# Patient Record
Sex: Female | Born: 1965 | Race: White | Hispanic: Yes | Marital: Married | Smoking: Never smoker
Health system: Southern US, Community
[De-identification: ages and names within clinical notes are randomized; demographics above are authoritative.]

## PROBLEM LIST (undated history)

## (undated) DIAGNOSIS — R51 Headache: Secondary | ICD-10-CM

## (undated) DIAGNOSIS — Z889 Allergy status to unspecified drugs, medicaments and biological substances status: Secondary | ICD-10-CM

## (undated) DIAGNOSIS — R519 Headache, unspecified: Secondary | ICD-10-CM

## (undated) DIAGNOSIS — M1990 Unspecified osteoarthritis, unspecified site: Secondary | ICD-10-CM

## (undated) DIAGNOSIS — K219 Gastro-esophageal reflux disease without esophagitis: Secondary | ICD-10-CM

## (undated) HISTORY — PX: HERNIA REPAIR: SHX51

---

## 1999-12-31 ENCOUNTER — Inpatient Hospital Stay (HOSPITAL_COMMUNITY): Admission: AD | Admit: 1999-12-31 | Discharge: 1999-12-31 | Payer: Self-pay | Admitting: Obstetrics

## 2000-01-01 ENCOUNTER — Encounter: Payer: Self-pay | Admitting: Obstetrics

## 2000-01-07 ENCOUNTER — Ambulatory Visit (HOSPITAL_COMMUNITY): Admission: RE | Admit: 2000-01-07 | Discharge: 2000-01-07 | Payer: Self-pay | Admitting: *Deleted

## 2002-05-11 ENCOUNTER — Ambulatory Visit (HOSPITAL_COMMUNITY): Admission: RE | Admit: 2002-05-11 | Discharge: 2002-05-11 | Payer: Self-pay | Admitting: *Deleted

## 2002-08-09 ENCOUNTER — Ambulatory Visit (HOSPITAL_COMMUNITY): Admission: RE | Admit: 2002-08-09 | Discharge: 2002-08-09 | Payer: Self-pay | Admitting: *Deleted

## 2002-10-12 ENCOUNTER — Inpatient Hospital Stay (HOSPITAL_COMMUNITY): Admission: AD | Admit: 2002-10-12 | Discharge: 2002-10-12 | Payer: Self-pay | Admitting: Obstetrics & Gynecology

## 2002-10-16 ENCOUNTER — Inpatient Hospital Stay (HOSPITAL_COMMUNITY): Admission: AD | Admit: 2002-10-16 | Discharge: 2002-10-16 | Payer: Self-pay | Admitting: Obstetrics & Gynecology

## 2002-10-16 ENCOUNTER — Encounter: Payer: Self-pay | Admitting: Obstetrics & Gynecology

## 2002-10-19 ENCOUNTER — Inpatient Hospital Stay (HOSPITAL_COMMUNITY): Admission: AD | Admit: 2002-10-19 | Discharge: 2002-10-19 | Payer: Self-pay | Admitting: Obstetrics & Gynecology

## 2002-10-20 ENCOUNTER — Inpatient Hospital Stay (HOSPITAL_COMMUNITY): Admission: AD | Admit: 2002-10-20 | Discharge: 2002-10-22 | Payer: Self-pay | Admitting: *Deleted

## 2006-05-25 ENCOUNTER — Encounter (INDEPENDENT_AMBULATORY_CARE_PROVIDER_SITE_OTHER): Payer: Self-pay | Admitting: Nurse Practitioner

## 2006-05-25 LAB — CONVERTED CEMR LAB: Pap Smear: NORMAL

## 2006-09-29 ENCOUNTER — Ambulatory Visit: Payer: Self-pay | Admitting: Family Medicine

## 2006-09-29 ENCOUNTER — Encounter (INDEPENDENT_AMBULATORY_CARE_PROVIDER_SITE_OTHER): Payer: Self-pay | Admitting: Nurse Practitioner

## 2006-09-29 DIAGNOSIS — J309 Allergic rhinitis, unspecified: Secondary | ICD-10-CM | POA: Insufficient documentation

## 2006-09-29 DIAGNOSIS — G43909 Migraine, unspecified, not intractable, without status migrainosus: Secondary | ICD-10-CM | POA: Insufficient documentation

## 2006-09-29 DIAGNOSIS — S335XXA Sprain of ligaments of lumbar spine, initial encounter: Secondary | ICD-10-CM

## 2006-09-29 LAB — CONVERTED CEMR LAB
AST: 15 units/L (ref 0–37)
Albumin: 4.2 g/dL (ref 3.5–5.2)
BUN: 10 mg/dL (ref 6–23)
Basophils Absolute: 0.1 10*3/uL (ref 0.0–0.1)
CO2: 25 meq/L (ref 19–32)
Cholesterol: 188 mg/dL (ref 0–200)
Glucose, Bld: 82 mg/dL (ref 70–99)
HDL: 49 mg/dL (ref >39)
Hemoglobin: 13.2 g/dL (ref 12.0–15.0)
LDL Cholesterol: 109 mg/dL — ABNORMAL HIGH (ref 0–99)
Lymphocytes Relative: 35 % (ref 12–46)
Lymphs Abs: 3.1 10*3/uL (ref 0.7–3.3)
MCHC: 31.1 g/dL (ref 30.0–36.0)
MCV: 89.1 fL (ref 78.0–100.0)
Monocytes Absolute: 0.6 10*3/uL (ref 0.2–0.7)
Monocytes Relative: 7 % (ref 3–11)
Platelets: 345 10*3/uL (ref 150–400)
Potassium: 4.5 meq/L (ref 3.5–5.3)
RBC: 4.77 M/uL (ref 3.87–5.11)
Total Bilirubin: 0.5 mg/dL (ref 0.3–1.2)
Total CHOL/HDL Ratio: 3.8
Triglycerides: 148 mg/dL (ref <150)
VLDL: 30 mg/dL (ref 0–40)
WBC, blood: 8.9 10*3/uL

## 2006-10-01 ENCOUNTER — Ambulatory Visit: Payer: Self-pay | Admitting: *Deleted

## 2006-10-08 ENCOUNTER — Encounter: Payer: Self-pay | Admitting: Nurse Practitioner

## 2006-11-05 ENCOUNTER — Telehealth (INDEPENDENT_AMBULATORY_CARE_PROVIDER_SITE_OTHER): Payer: Self-pay | Admitting: *Deleted

## 2006-11-11 ENCOUNTER — Ambulatory Visit: Payer: Self-pay | Admitting: Nurse Practitioner

## 2006-11-11 DIAGNOSIS — B351 Tinea unguium: Secondary | ICD-10-CM

## 2006-12-06 ENCOUNTER — Telehealth (INDEPENDENT_AMBULATORY_CARE_PROVIDER_SITE_OTHER): Payer: Self-pay | Admitting: *Deleted

## 2006-12-14 ENCOUNTER — Ambulatory Visit: Payer: Self-pay | Admitting: Nurse Practitioner

## 2007-03-28 ENCOUNTER — Ambulatory Visit: Payer: Self-pay | Admitting: Nurse Practitioner

## 2007-03-28 DIAGNOSIS — N3289 Other specified disorders of bladder: Secondary | ICD-10-CM

## 2007-03-28 LAB — CONVERTED CEMR LAB
Bilirubin Urine: NEGATIVE
Blood in Urine, dipstick: NEGATIVE
Ketones, urine, test strip: NEGATIVE
Protein, U semiquant: NEGATIVE
Specific Gravity, Urine: >=1.03
WBC Urine, dipstick: NEGATIVE

## 2007-12-19 ENCOUNTER — Ambulatory Visit: Payer: Self-pay | Admitting: Nurse Practitioner

## 2007-12-19 DIAGNOSIS — M545 Low back pain: Secondary | ICD-10-CM

## 2007-12-19 DIAGNOSIS — I8390 Asymptomatic varicose veins of unspecified lower extremity: Secondary | ICD-10-CM

## 2007-12-19 DIAGNOSIS — N946 Dysmenorrhea, unspecified: Secondary | ICD-10-CM

## 2007-12-19 DIAGNOSIS — M722 Plantar fascial fibromatosis: Secondary | ICD-10-CM

## 2007-12-19 DIAGNOSIS — J029 Acute pharyngitis, unspecified: Secondary | ICD-10-CM | POA: Insufficient documentation

## 2007-12-20 ENCOUNTER — Encounter (INDEPENDENT_AMBULATORY_CARE_PROVIDER_SITE_OTHER): Payer: Self-pay | Admitting: Nurse Practitioner

## 2008-01-04 ENCOUNTER — Telehealth (INDEPENDENT_AMBULATORY_CARE_PROVIDER_SITE_OTHER): Payer: Self-pay | Admitting: Nurse Practitioner

## 2008-01-06 ENCOUNTER — Ambulatory Visit (HOSPITAL_COMMUNITY): Admission: RE | Admit: 2008-01-06 | Discharge: 2008-01-06 | Payer: Self-pay | Admitting: Family Medicine

## 2008-01-06 ENCOUNTER — Encounter (INDEPENDENT_AMBULATORY_CARE_PROVIDER_SITE_OTHER): Payer: Self-pay | Admitting: Nurse Practitioner

## 2008-01-11 ENCOUNTER — Encounter (INDEPENDENT_AMBULATORY_CARE_PROVIDER_SITE_OTHER): Payer: Self-pay | Admitting: Nurse Practitioner

## 2008-03-08 ENCOUNTER — Telehealth (INDEPENDENT_AMBULATORY_CARE_PROVIDER_SITE_OTHER): Payer: Self-pay | Admitting: *Deleted

## 2008-03-13 ENCOUNTER — Ambulatory Visit: Payer: Self-pay | Admitting: Nurse Practitioner

## 2008-03-13 DIAGNOSIS — J329 Chronic sinusitis, unspecified: Secondary | ICD-10-CM | POA: Insufficient documentation

## 2008-05-21 ENCOUNTER — Ambulatory Visit: Payer: Self-pay | Admitting: Nurse Practitioner

## 2008-05-21 DIAGNOSIS — M25519 Pain in unspecified shoulder: Secondary | ICD-10-CM

## 2008-05-21 LAB — CONVERTED CEMR LAB
BUN: 11 mg/dL (ref 6–23)
CO2: 28 meq/L (ref 19–32)
Calcium: 9.8 mg/dL (ref 8.4–10.5)
Creatinine, Ser: 0.55 mg/dL (ref 0.40–1.20)
Glucose, Bld: 97 mg/dL (ref 70–99)
Sodium: 137 meq/L (ref 135–145)

## 2008-05-23 ENCOUNTER — Ambulatory Visit (HOSPITAL_COMMUNITY): Admission: RE | Admit: 2008-05-23 | Discharge: 2008-05-23 | Payer: Self-pay | Admitting: Family Medicine

## 2008-06-01 ENCOUNTER — Encounter (INDEPENDENT_AMBULATORY_CARE_PROVIDER_SITE_OTHER): Payer: Self-pay | Admitting: Nurse Practitioner

## 2008-06-09 ENCOUNTER — Encounter (INDEPENDENT_AMBULATORY_CARE_PROVIDER_SITE_OTHER): Payer: Self-pay | Admitting: Nurse Practitioner

## 2008-06-09 LAB — CONVERTED CEMR LAB
Cholesterol: 184 mg/dL
Total CHOL/HDL Ratio: 3.5
VLDL: 24 mg/dL

## 2008-07-05 ENCOUNTER — Ambulatory Visit: Payer: Self-pay | Admitting: Nurse Practitioner

## 2008-07-05 DIAGNOSIS — D649 Anemia, unspecified: Secondary | ICD-10-CM

## 2008-07-06 DIAGNOSIS — D509 Iron deficiency anemia, unspecified: Secondary | ICD-10-CM

## 2008-07-06 LAB — CONVERTED CEMR LAB
Platelets: 338 10*3/uL (ref 150–400)
RDW: 16.7 % — ABNORMAL HIGH (ref 11.5–15.5)
WBC: 7.7 10*3/uL (ref 4.0–10.5)

## 2008-07-10 ENCOUNTER — Encounter (INDEPENDENT_AMBULATORY_CARE_PROVIDER_SITE_OTHER): Payer: Self-pay | Admitting: Nurse Practitioner

## 2008-07-16 ENCOUNTER — Encounter (INDEPENDENT_AMBULATORY_CARE_PROVIDER_SITE_OTHER): Payer: Self-pay | Admitting: Nurse Practitioner

## 2009-04-22 ENCOUNTER — Encounter: Admission: RE | Admit: 2009-04-22 | Discharge: 2009-04-22 | Payer: Self-pay | Admitting: Specialist

## 2009-07-08 ENCOUNTER — Ambulatory Visit: Payer: Self-pay | Admitting: Gynecology

## 2009-07-18 ENCOUNTER — Ambulatory Visit: Payer: Self-pay | Admitting: Nurse Practitioner

## 2009-07-18 DIAGNOSIS — K644 Residual hemorrhoidal skin tags: Secondary | ICD-10-CM | POA: Insufficient documentation

## 2009-07-18 DIAGNOSIS — M2550 Pain in unspecified joint: Secondary | ICD-10-CM | POA: Insufficient documentation

## 2009-07-18 DIAGNOSIS — K5900 Constipation, unspecified: Secondary | ICD-10-CM | POA: Insufficient documentation

## 2009-07-18 LAB — CONVERTED CEMR LAB
ALT: 11 units/L (ref 0–35)
Alkaline Phosphatase: 34 units/L — ABNORMAL LOW (ref 39–117)
BUN: 15 mg/dL (ref 6–23)
Basophils Absolute: 0 10*3/uL (ref 0.0–0.1)
Bilirubin Urine: NEGATIVE
Blood in Urine, dipstick: NEGATIVE
CRP: 0.1 mg/dL (ref <0.6)
Calcium: 8.8 mg/dL (ref 8.4–10.5)
Chlamydia, DNA Probe: NEGATIVE
Eosinophils Relative: 1 % (ref 0–5)
GC Probe Amp, Genital: NEGATIVE
Lymphocytes Relative: 29 % (ref 12–46)
MCHC: 30.6 g/dL (ref 30.0–36.0)
MCV: 82.4 fL (ref 78.0–100.0)
Monocytes Relative: 7 % (ref 3–12)
Neutrophils Relative %: 63 % (ref 43–77)
OCCULT 1: NEGATIVE
Protein, U semiquant: NEGATIVE
Specific Gravity, Urine: >=1.03
TSH: 1.437 microintl units/mL (ref 0.350–4.500)
Total Protein: 6.6 g/dL (ref 6.0–8.3)
Urobilinogen, UA: 0.2
WBC Urine, dipstick: NEGATIVE
WBC: 6.7 10*3/uL (ref 4.0–10.5)

## 2009-07-23 ENCOUNTER — Encounter (INDEPENDENT_AMBULATORY_CARE_PROVIDER_SITE_OTHER): Payer: Self-pay | Admitting: Nurse Practitioner

## 2009-07-25 LAB — CONVERTED CEMR LAB: Pap Smear: NEGATIVE

## 2009-08-09 ENCOUNTER — Ambulatory Visit: Payer: Self-pay | Admitting: Nurse Practitioner

## 2009-08-09 LAB — CONVERTED CEMR LAB
Cholesterol: 187 mg/dL (ref 0–200)
Total CHOL/HDL Ratio: 3.7
Triglycerides: 146 mg/dL (ref <150)
VLDL: 29 mg/dL (ref 0–40)

## 2009-08-12 ENCOUNTER — Encounter (INDEPENDENT_AMBULATORY_CARE_PROVIDER_SITE_OTHER): Payer: Self-pay | Admitting: Nurse Practitioner

## 2009-08-12 ENCOUNTER — Ambulatory Visit (HOSPITAL_COMMUNITY): Admission: RE | Admit: 2009-08-12 | Discharge: 2009-08-12 | Payer: Self-pay | Admitting: Internal Medicine

## 2009-08-16 ENCOUNTER — Encounter: Admission: RE | Admit: 2009-08-16 | Discharge: 2009-08-16 | Payer: Self-pay | Admitting: Internal Medicine

## 2009-08-16 ENCOUNTER — Encounter (INDEPENDENT_AMBULATORY_CARE_PROVIDER_SITE_OTHER): Payer: Self-pay | Admitting: Nurse Practitioner

## 2009-10-28 ENCOUNTER — Emergency Department (HOSPITAL_COMMUNITY): Admission: EM | Admit: 2009-10-28 | Discharge: 2009-10-28 | Payer: Self-pay | Admitting: Emergency Medicine

## 2010-02-10 ENCOUNTER — Encounter
Admission: RE | Admit: 2010-02-10 | Discharge: 2010-02-10 | Payer: Self-pay | Source: Home / Self Care | Attending: Specialist | Admitting: Specialist

## 2010-02-27 ENCOUNTER — Ambulatory Visit: Admit: 2010-02-27 | Payer: Self-pay | Admitting: Nurse Practitioner

## 2010-03-16 ENCOUNTER — Encounter: Payer: Self-pay | Admitting: Family Medicine

## 2010-03-17 ENCOUNTER — Ambulatory Visit
Admission: RE | Admit: 2010-03-17 | Discharge: 2010-03-17 | Payer: Self-pay | Source: Home / Self Care | Attending: Nurse Practitioner | Admitting: Nurse Practitioner

## 2010-03-17 DIAGNOSIS — N390 Urinary tract infection, site not specified: Secondary | ICD-10-CM | POA: Insufficient documentation

## 2010-03-17 LAB — CONVERTED CEMR LAB
Blood in Urine, dipstick: NEGATIVE
Ketones, urine, test strip: NEGATIVE
Protein, U semiquant: NEGATIVE
WBC Urine, dipstick: NEGATIVE

## 2010-03-18 ENCOUNTER — Encounter (INDEPENDENT_AMBULATORY_CARE_PROVIDER_SITE_OTHER): Payer: Self-pay | Admitting: Nurse Practitioner

## 2010-03-23 LAB — CONVERTED CEMR LAB
ALT: 12 units/L (ref 0–35)
Basophils Relative: 1 % (ref 0–1)
Chloride: 103 meq/L (ref 96–112)
Creatinine, Ser: 0.49 mg/dL (ref 0.40–1.20)
Eosinophils Absolute: 0.1 10*3/uL (ref 0.0–0.7)
Eosinophils Relative: 1 % (ref 0–5)
HCT: 41.5 % (ref 36.0–46.0)
HDL: 50 mg/dL (ref >39)
Hemoglobin: 12.6 g/dL (ref 12.0–15.0)
Lymphocytes Relative: 35 % (ref 12–46)
MCHC: 30.4 g/dL (ref 30.0–36.0)
Neutro Abs: 4.3 10*3/uL (ref 1.7–7.7)
Neutrophils Relative %: 57 % (ref 43–77)
Platelets: 309 10*3/uL (ref 150–400)
Potassium: 4.6 meq/L (ref 3.5–5.3)
RDW: 15.4 % (ref 11.5–15.5)
Sodium: 137 meq/L (ref 135–145)
Total Bilirubin: 0.4 mg/dL (ref 0.3–1.2)
Total CHOL/HDL Ratio: 3.8
Urobilinogen, UA: 0.2
VLDL: 20 mg/dL (ref 0–40)
WBC Urine, dipstick: NEGATIVE
WBC: 7.5 10*3/uL (ref 4.0–10.5)
pH: 5.5

## 2010-03-25 NOTE — Letter (Signed)
Summary: *HSN Results Follow up  [facility]  [address] [place], [place] [address]   Phone: [phone]  Fax: [phone]      07/23/2009   Brittany Henry [address] [place], [place]  [address]   Dear  Ms. [name],                            ____S.Drinkard,FNP   ____D. Gore,FNP       ____B. McPherson,MD   ____V. Rankins,MD    ____E. Mulberry,MD    _X___N. Daphine Deutscher, FNP  ____D. Reche Dixon, MD    ____K. Philipp Deputy, MD    ____Other     This letter is to inform you that your recent test(s):  ___X____Pap Smear    __X_____Lab Test     _______X-ray    ___X____ is within acceptable limits  _______ requires a medication change  _______ requires a follow-up lab visit  _______ requires a follow-up visit with your provider   Comments: Labs done during recent office visit are ok except you are slightly anemic.  You should be taking an iron supplement daily - ferrous sulfate 325mg  by mouth  daily, which can be purchased over the counter.  Pap Smear results ________________________.      _________________________________________________________ If you have any questions, please contact our office [phone].                    Sincerely,    Lehman Prom FNP [facility]

## 2010-03-25 NOTE — Assessment & Plan Note (Signed)
Summary: return to office on next week for nurse visit-- lipids//gk  Nurse Visit   Allergies: No Known Drug Allergies  Orders Added: 1)  Est. Patient Level I [60454] 2)  T-Lipid Profile [09811-91478]

## 2010-03-25 NOTE — Letter (Signed)
Summary: Lipid Letter  [facility]  [address] [place], [place] [address]   Phone: [phone]  Fax: [phone]    08/12/2009  Brittany Henry [address] [place], [place]  [address]  Dear Brittany Henry:  We have carefully reviewed your last lipid profile from 08/09/2009 and the results are noted below with a summary of recommendations for lipid management.    Cholesterol:       187     Goal: less than 200   HDL "good" Cholesterol:   51     Goal: greater than 40   LDL "bad" Cholesterol:   107     Goal: less than 130   Triglycerides:       146     Goal: less than 130    Your cholesterol labs are shown above.  No treatment necessary.     Current Medications: 1)    Allegra 180 Mg Tabs (Fexofenadine hcl) .... One tablet by mouth daily for allergies 2)    Naprosyn 500 Mg Tabs (Naproxen) .Marland Kitchen.. 1 tablet by mouth two times a day as needed for pain 3)    Nasacort Aq 55 Mcg/act Aers (Triamcinolone acetonide(nasal)) .Marland Kitchen.. 1 spray in each nostril two times a day 4)    Ferrous Sulfate 325 (65 Fe) Mg Tbec (Ferrous sulfate) .... One tablet by mouth two times a day to build up blood  If you have any questions, please call. We appreciate being able to work with you.   Sincerely,    [facility] Lehman Prom FNP

## 2010-03-25 NOTE — Letter (Signed)
Summary: Handout Printed  Printed Handout:  - Diet - High-Fiber 

## 2010-03-25 NOTE — Assessment & Plan Note (Signed)
Summary: Complete Physical Exam   Vital Signs:  Patient profile:   45 year old female Menstrual status:  regular LMP:     06/17/2009 Height:      65 inches Weight:      175.2 pounds BMI:     29.26 Pulse rate:   73 / minute Pulse rhythm:   regular Resp:     16 per minute BP sitting:   101 / 68  (left arm) Cuff size:   regular  Vitals Entered By: Levon Hedger (Jul 18, 2009 9:47 AM) CC: CPP Is Patient Diabetic? No Pain Assessment Patient in pain? yes     Location: bones , joints  Does patient need assistance? Functional Status Self care Ambulation Normal LMP (date): 06/17/2009 LMP - Character: normal     Menstrual Status regular Enter LMP: 06/17/2009 Last PAP Result  Specimen Adequacy: Satisfactory for evaluation.   Interpretation/Result:Negative for intraepithelial Lesion or Malignancy. Acute inflammation      CC:  CPP.  History of Present Illness:  Pt into the office for a complete physical exam.  PAP - last pap over 1 year ago - all previous normal results Last menses 06/17/2009, monthly  Pt is married but uses condoms  Mammogram - last mammogram in 2009 no family history of breast cancer  Dental - last exam was 4 months ago for cleaning  Optho - wears glasses. last eye exam was 8 months ago but she did not get the new glasses because she was not able to afford.  Habits & Providers  Alcohol-Tobacco-Diet     Alcohol drinks/day: 0     Tobacco Status: never     Tobacco Counseling: not indicated; no tobacco use     Passive Smoke Exposure: no  Exercise-Depression-Behavior     Does Patient Exercise: no     Have you felt down or hopeless? no     Have you felt little pleasure in things? yes     Depression Counseling: not indicated; screening negative for depression     Drug Use: never     Seat Belt Use: 100  Comments: PHQ-9 score = 19  Allergies (verified): No Known Drug Allergies  Review of Systems General:  Denies fever. Eyes:  Denies  blurring. ENT:  Denies earache. CV:  Denies chest pain or discomfort and difficulty breathing at night. Resp:  Denies cough. GI:  Complains of constipation; denies abdominal pain, nausea, and vomiting. GU:  Denies dysuria. MS:  Complains of joint pain; hand, and hips stiffness especially in the morning.  Pt takes the naprosyn but sometimes it is not effective. . Derm:  Denies rash. Neuro:  Denies headaches. Psych:  Denies anxiety and depression.  Physical Exam  General:  alert.   Head:  normocephalic.   Eyes:  glasses Ears:  ear piercing(s) noted.   Nose:  no nasal discharge.   Mouth:  pharynx pink and moist and fair dentition.   Neck:  supple.   Chest Wall:  no mass.   Breasts:  left upper outer dense tissue right - no masses Lungs:  normal breath sounds.   Heart:  normal rate and regular rhythm.   Abdomen:  soft, non-tender, and normal bowel sounds.   Rectal:  external hemorrhoid(s).   Msk:  normal ROM.   Pulses:  R radial normal and L radial normal.   Extremities:  no edema Neurologic:  alert & oriented X3, cranial nerves II-XII intact, and gait normal.   Skin:  color normal.  Psych:  Oriented X3.    Pelvic Exam  Vulva:      normal appearance.   Urethra and Bladder:      Urethra--no discharge.   Vagina:      physiologic discharge.   Cervix:      midposition.   Uterus:      smooth.   Adnexa:      nontender bilaterally.   Rectum:      heme negative stool, + external hemorrhoids.      Impression & Recommendations:  Problem # 1:  ROUTINE GYNECOLOGICAL EXAMINATION (ICD-V72.31) labs done except lipids - pt is not fasting advised routine optho and dental exam PHQ-9 score = 19; would recommend pt be referred to LCSW; advised pt of the services PAP done  Orders: T-Comprehensive Metabolic Panel [phone]) T-CBC w/Diff ([id number]) T-TSH ([id number]) Rapid HIV  (57846) UA Dipstick w/o Micro (manual) (96295) Hemoccult Guaiac-1 spec.(in office)  (82270) KOH/ WET Mount [id number]) Pap Smear, Thin Prep ( Collection of) (Q0091) T- GC Chlamydia (24401)  Problem # 2:  UNSPECIFIED BREAST SCREENING (ICD-V76.10) mammogram ordered self breast exam reviewed with pt Orders: Mammogram (Screening) (Mammo)  Problem # 3:  PAIN IN JOINT, MULTIPLE SITES (ICD-719.49) will check labs pt to take anti-inflammatories Orders: T-Sed Rate (Automated) [phone]) T-Rheumatoid Factor [phone]) T-C-Reactive Protein ([id number]) T-Uric Acid (Blood) (51884-16606)  Problem # 4:  CONSTIPATION (ICD-564.00) advsied pt to add more fiber in her diet handout given and fiber supplements  Complete Medication List: 1)  Allegra 180 Mg Tabs (Fexofenadine hcl) .... One tablet by mouth daily for allergies 2)  Naprosyn 500 Mg Tabs (Naproxen) .Marland Kitchen.. 1 tablet by mouth two times a day as needed for pain 3)  Nasacort Aq 55 Mcg/act Aers (Triamcinolone acetonide(nasal)) .Marland Kitchen.. 1 spray in each nostril two times a day 4)  Ferrous Sulfate 325 (65 Fe) Mg Tbec (Ferrous sulfate) .... One tablet by mouth two times a day to build up blood  Patient Instructions: 1)  Return to office on next week for nurse visit - lipids 2)  Do not eat after midnight before this visit 3)  Keep you appointment for mammogram 4)  You will be notified of any abnormal lab results 5)  You should add more fiber in your diet - read handout  Laboratory Results   Urine Tests  Date/Time Received: Jul 18, 2009 12:54 PM   Routine Urinalysis   Color: lt. yellow Appearance: Clear Glucose: negative   (Normal Range: Negative) Bilirubin: negative   (Normal Range: Negative) Ketone: trace (5)   (Normal Range: Negative) Spec. Gravity: >=1.030   (Normal Range: 1.003-1.035) Blood: negative   (Normal Range: Negative) pH: 5.0   (Normal Range: 5.0-8.0) Protein: negative   (Normal Range: Negative) Urobilinogen: 0.2   (Normal Range: 0-1) Nitrite: negative   (Normal Range: Negative) Leukocyte Esterace:  negative   (Normal Range: Negative)    Date/Time Received: Jul 18, 2009 12:53 PM   [employer] Source: vaginal WBC/hpf: 1-5 Bacteria/hpf: rare Clue cells/hpf: none Yeast/hpf: none Wet Mount KOH: Negative Trichomonas/hpf: none  Other Tests  Rapid HIV: negative  Stool - Occult Blood Hemmoccult #1: negative Date: 07/18/2009

## 2010-03-25 NOTE — Progress Notes (Signed)
Summary: Office Visit//DEPRESSION SCREENING  Office Visit//DEPRESSION SCREENING   Imported By: Arta Bruce 09/02/2009 12:16:34  _____________________________________________________________________  External Attachment:    Type:   Image     Comment:   External Document

## 2010-03-27 NOTE — Assessment & Plan Note (Signed)
Summary: Urinary problems   Vital Signs:  Patient profile:   45 year old female Menstrual status:  regular LMP:     03/11/2010 Weight:      180.6 pounds BMI:     30.16 Temp:     97.9 degrees F oral Pulse rate:   68 / minute Pulse rhythm:   regular Resp:     16 per minute BP sitting:   120 / 78  (left arm) Cuff size:   regular  Vitals Entered By: Levon Hedger (March 17, 2010 10:21 AM)  Nutrition Counseling: Patient's BMI is greater than 25 and therefore counseled on weight management options. CC: pt  c/o low back pain ...lower abdominal pain x 2 months with intermittent vaginal discharge pt states she has dysuria, Vaginal discharge Is Patient Diabetic? No Pain Assessment Patient in pain? yes     Location: back Intensity: 5  Does patient need assistance? Functional Status Self care Ambulation Normal LMP (date): 03/11/2010 LMP - Character: normal     Menstrual Status regular Enter LMP: 03/11/2010 Last PAP Result  Specimen Adequacy: Satisfactory for evaluation.   Interpretation/Result:Negative for intraepithelial Lesion or Malignancy.      CC:  pt  c/o low back pain ...lower abdominal pain x 2 months with intermittent vaginal discharge pt states she has dysuria and Vaginal discharge.  History of Present Illness:    Vaginal discharge      This is a 45 year old woman who presents with Vaginal discharge.  The symptoms began 1 month ago.  The severity is described as mild.  The patient complains of itching, burning on urination, frequency, pelvic pain, and back pain, but denies vaginal burning, urgency, and fever.  The discharge is described as yellow.  Prior treatment has included no prior treatment.   Pt has used cranberry juice for symptoms Menses - last 03/11/2010 November - regular menses December - no menses (skipped) 1st time she every skipped menses  Bilingual   Allergies (verified): No Known Drug Allergies  Review of Systems CV:  Denies chest pain or  discomfort. Resp:  Denies cough. GI:  Denies nausea and vomiting. GU:  Complains of discharge, hematuria, and urinary frequency.  Physical Exam  General:  alert.   Head:  normocephalic.   Eyes:  glasses Lungs:  normal breath sounds.   Heart:  normal rate and regular rhythm.   Abdomen:  No CVA tenderness BS x 4  nontender  Msk:  up to the exam table Neurologic:  alert & oriented X3.   Skin:  color normal.   Psych:  Oriented X3.     Impression & Recommendations:  Problem # 1:  UTI (ICD-599.0)  will send urine for culture but wil treat based on length of symptoms will also give fluconazole to take at end of antibiotic treatment Orders: T-Culture, Urine ([id number]) UA Dipstick w/o Micro (manual) (87564)  Her updated medication list for this problem includes:    Ciprofloxacin Hcl 500 Mg Tabs (Ciprofloxacin hcl) ..... One tablet by mouth two times a day x 3 days  Complete Medication List: 1)  Ciprofloxacin Hcl 500 Mg Tabs (Ciprofloxacin hcl) .... One tablet by mouth two times a day x 3 days 2)  Fluconazole 150 Mg Tabs (Fluconazole) .... One tablet by mouth x 1 dose  Patient Instructions: 1)  Urine will be sent for culture to determine if bacteria is present.   2)  Since you problem has been ongoing for the past month will treat with cipro  500mg  by mouth two times a day for 3 days. 3)  Take the fluconazole 150mg  by mouth x 1 dose the day AFTER you finish the antibiotics. 4)  Follow up as needed Prescriptions: FLUCONAZOLE 150 MG TABS (FLUCONAZOLE) One tablet by mouth x 1 dose  #1 x 0   Entered and Authorized by:   Lehman Prom FNP   Signed by:   Lehman Prom FNP on 03/17/2010   Method used:   Print then Give to Patient   RxID:   [id number] CIPROFLOXACIN HCL 500 MG TABS (CIPROFLOXACIN HCL) One tablet by mouth two times a day x 3 days  #6 x 0   Entered and Authorized by:   Lehman Prom FNP   Signed by:   Lehman Prom FNP on 03/17/2010   Method used:    Print then Give to Patient   RxID:   [id number]    Orders Added: 1)  Est. Patient Level III [72536] 2)  T-Culture, Urine [[id number]] 3)  UA Dipstick w/o Micro (manual) [81002]    Prevention & Chronic Care Immunizations   Influenza vaccine: historical per pt - given in another office  (12/24/2009)   Influenza vaccine deferral: Not indicated  (03/17/2010)    Tetanus booster: 02/23/2002: historical per pt    Pneumococcal vaccine: Not documented  Other Screening   Pap smear:  Specimen Adequacy: Satisfactory for evaluation.   Interpretation/Result:Negative for intraepithelial Lesion or Malignancy.     (07/18/2009)   Pap smear action/deferral: PAP smear done  (12/19/2007)   Pap smear due: 07/2010    Mammogram: BI-RADS CATEGORY 1:  Negative.^MM DIGITAL DIAG LTD L  (08/16/2009)   Mammogram action/deferral: Screening mammogram in 1 year.     (01/06/2008)   Smoking status: never  (07/18/2009)  Lipids   Total Cholesterol: 187  (08/09/2009)   LDL: 107  (08/09/2009)   LDL Direct: Not documented   HDL: 51  (08/09/2009)   Triglycerides: 146  (08/09/2009)   Nursing Instructions: Give Flu vaccine today    Laboratory Results   Urine Tests  Date/Time Received: March 17, 2010 10:59 AM   Routine Urinalysis   Color: lt. yellow Glucose: negative   (Normal Range: Negative) Bilirubin: negative   (Normal Range: Negative) Ketone: negative   (Normal Range: Negative) Spec. Gravity: >=1.030   (Normal Range: 1.003-1.035) Blood: negative   (Normal Range: Negative) pH: 5.0   (Normal Range: 5.0-8.0) Protein: negative   (Normal Range: Negative) Urobilinogen: 0.2   (Normal Range: 0-1) Nitrite: negative   (Normal Range: Negative) Leukocyte Esterace: negative   (Normal Range: Negative)

## 2010-07-11 NOTE — Discharge Summary (Signed)
   Brittany Henry, Brittany Henry                  ACCOUNT NO.:  [id number]   MEDICAL RECORD NO.:  [id number]                   PATIENT TYPE:  INP   LOCATION:  9104                                 FACILITY:  [facility]   PHYSICIAN:  Noelle C. Merilynn Finland, M.D.           DATE OF BIRTH:  Oct 08, 1965   DATE OF ADMISSION:  10/20/2002  DATE OF DISCHARGE:  10/22/2002                                 DISCHARGE SUMMARY   DISCHARGE SUMMARY:  Term pregnancy, delivered.   DISCHARGE MEDICATIONS:  1. Prenatal vitamins daily.  2. Ibuprofen 600 mg p.o. q.6h. p.r.n.  3. Depo Provera.   DISCHARGE INSTRUCTIONS:  Per routine.   FOLLOWUP:  The patient to follow up at [facility] in six weeks.   BRIEF HOSPITAL COURSE:  On October 20, 2002 this 45 year old Hispanic female  presented in active labor and went on to deliver a viable female infant by  normal spontaneous vaginal delivery.  She presented as a G4, P2-0-1-20 at 40  weeks and 5 days.  Nuchal cord x2 reduced at delivery, intact three vessel  cord placenta, no lacerations.  Estimated blood loss 150 mL.  Apgar's eight  at 1 minute and nine at 5 minutes.  The patient did well postpartum and was  ready to leave on postpartum day #2.  The infant was circumcised prior to  going home.  She desired Depo Provera and got her first injection prior to  leaving.  She was breastfeeding with minimal pain at the time of delivery.  Of note, the patient did have gestational diabetes and was controlled on a  diabetic diet although apparently there was poor compliance.  This should be  followed as an outpatient.                                               Noelle C. Merilynn Finland, M.D.    NCR/MEDQ  D:  10/22/2002  T:  10/22/2002  Job:  [id number]

## 2011-02-03 ENCOUNTER — Other Ambulatory Visit: Payer: Self-pay | Admitting: Internal Medicine

## 2011-03-25 ENCOUNTER — Encounter (HOSPITAL_COMMUNITY): Payer: Self-pay

## 2011-03-25 ENCOUNTER — Ambulatory Visit (HOSPITAL_COMMUNITY)
Admission: RE | Admit: 2011-03-25 | Discharge: 2011-03-25 | Disposition: A | Discharge status: Home Health | Payer: BC Managed Care – PPO | Source: Ambulatory Visit | Attending: Gastroenterology | Admitting: Gastroenterology

## 2011-03-25 ENCOUNTER — Other Ambulatory Visit: Payer: Self-pay | Admitting: Gastroenterology

## 2011-03-25 ENCOUNTER — Encounter (HOSPITAL_COMMUNITY)
Admission: RE | Disposition: A | Discharge status: Home Health | Payer: Self-pay | Source: Ambulatory Visit | Attending: Gastroenterology

## 2011-03-25 DIAGNOSIS — R142 Eructation: Secondary | ICD-10-CM | POA: Insufficient documentation

## 2011-03-25 DIAGNOSIS — R141 Gas pain: Secondary | ICD-10-CM | POA: Insufficient documentation

## 2011-03-25 DIAGNOSIS — K449 Diaphragmatic hernia without obstruction or gangrene: Secondary | ICD-10-CM | POA: Insufficient documentation

## 2011-03-25 DIAGNOSIS — R1310 Dysphagia, unspecified: Secondary | ICD-10-CM | POA: Insufficient documentation

## 2011-03-25 DIAGNOSIS — K2940 Chronic atrophic gastritis without bleeding: Secondary | ICD-10-CM | POA: Insufficient documentation

## 2011-03-25 HISTORY — PX: ESOPHAGOGASTRODUODENOSCOPY: SHX5428

## 2011-03-25 SURGERY — EGD (ESOPHAGOGASTRODUODENOSCOPY)
Anesthesia: Moderate Sedation

## 2011-03-25 MED ORDER — MIDAZOLAM HCL 10 MG/2ML IJ SOLN
INTRAMUSCULAR | Status: DC | PRN
  Administered 2011-03-25 (×4): 2.5 mg via INTRAVENOUS

## 2011-03-25 MED ORDER — FENTANYL CITRATE 0.05 MG/ML IJ SOLN
INTRAMUSCULAR | Status: AC
  Filled 2011-03-25: qty 2

## 2011-03-25 MED ORDER — BUTAMBEN-TETRACAINE-BENZOCAINE 2-2-14 % EX AERO
INHALATION_SPRAY | CUTANEOUS | Status: DC | PRN
  Administered 2011-03-25: 2 via TOPICAL

## 2011-03-25 MED ORDER — MIDAZOLAM HCL 10 MG/2ML IJ SOLN
INTRAMUSCULAR | Status: AC
  Filled 2011-03-25: qty 2

## 2011-03-25 MED ORDER — FENTANYL NICU IV SYRINGE 50 MCG/ML
INJECTION | INTRAMUSCULAR | Status: DC | PRN
  Administered 2011-03-25 (×4): 25 ug via INTRAVENOUS

## 2011-03-25 NOTE — Op Note (Signed)
[facility] [address] [place], [place]  [address]  ENDOSCOPY PROCEDURE REPORT  PATIENT:  Brittany Henry, Brittany Henry  MR#:  [id number] BIRTHDATE:  12-Jul-1965, 45 yrs. old  GENDER:  female ENDOSCOPIST:  Willis Modena, MD Referred by:  Norberto Sorenson, M.D. PROCEDURE DATE:  03/25/2011 PROCEDURE:  EGD with biopsy, [id number] ASA CLASS:  Class I INDICATIONS:  bloating, dysphagia, history of H. pylori MEDICATIONS:  Cetacaine spray x 2, Fentanyl 100 mcg IV, Versed 10 mg IV DESCRIPTION OF PROCEDURE:   After the risks benefits and alternatives of the procedure were thoroughly explained, informed consent was obtained.  The Pentax Gastroscope [id number] endoscope was introduced through the mouth and advanced to the second portion of the duodenum, without limitations.  The instrument was slowly withdrawn as the mucosa was fully examined. <<PROCEDUREIMAGES>> FINDINGS:  Small hiatal hernia, otherwise normal esophagus; no inflammation, stricture, mass, varices, or Barrett's epithelum; no mucosal features to suggest eosinophilic esophagitis were identified.  Mild atrophic pangastritis; given prior history of H. pylori, biopsies were taken to exclude persistent infecton. Otherwise normal stomach, pylorus, and duodenum to the second portion.  Random duodenal biopsies were taken to exclude celiac disease.  ENDOSCOPIC IMPRESSION:    1.  Small hiatal hernia and atrophic gastritis.  Gastric and duodenal biopsies obtained. 2.  Otherwise normal endoscopy.  No source of dysphagia identified.  RECOMMENDATIONS:      1.  Watch for potential complications of procedure. 2.  Await biopsy  results. 3.  Continue ranitidine for the time being. 4.  If biopsies are unrevealing, consider trial of probiotics and/or pancreatic enzymes. 5.  If symptoms persist, consider CT abd/pelvis with contrast (especially given family history of pancreatic cancer). 6.  OV 4-6 weeks.  ______________________________ Willis Modena  CC:  n. eSIGNEDWillis Modena at 03/25/2011 12:14 PM  Barnabas Lister, [phone]

## 2011-03-25 NOTE — H&P (Signed)
Patient interval history reviewed.  Patient examined again.  There has been no change from documented H/P dated 03/17/11 (scanned into chart from our office) except as documented above.

## 2011-03-25 NOTE — Brief Op Note (Signed)
Please see [facility] note dated 03/25/11.  

## 2011-03-26 ENCOUNTER — Encounter (HOSPITAL_COMMUNITY): Payer: Self-pay | Admitting: Gastroenterology

## 2012-08-17 ENCOUNTER — Inpatient Hospital Stay (HOSPITAL_COMMUNITY): Payer: BC Managed Care – PPO

## 2012-08-17 ENCOUNTER — Encounter (HOSPITAL_COMMUNITY): Payer: Self-pay | Admitting: *Deleted

## 2012-08-17 ENCOUNTER — Emergency Department (HOSPITAL_COMMUNITY)
Admission: EM | Admit: 2012-08-17 | Discharge: 2012-08-17 | Disposition: A | Discharge status: Home / Self Care | Payer: BC Managed Care – PPO | Source: Home / Self Care | Attending: Family Medicine | Admitting: Family Medicine

## 2012-08-17 ENCOUNTER — Inpatient Hospital Stay (HOSPITAL_COMMUNITY)
Admission: AD | Admit: 2012-08-17 | Discharge: 2012-08-18 | Disposition: A | Discharge status: Home / Self Care | Payer: BC Managed Care – PPO | Source: Ambulatory Visit | Attending: Obstetrics and Gynecology | Admitting: Obstetrics and Gynecology

## 2012-08-17 DIAGNOSIS — R1032 Left lower quadrant pain: Secondary | ICD-10-CM

## 2012-08-17 DIAGNOSIS — R52 Pain, unspecified: Secondary | ICD-10-CM

## 2012-08-17 DIAGNOSIS — M5432 Sciatica, left side: Secondary | ICD-10-CM

## 2012-08-17 DIAGNOSIS — K429 Umbilical hernia without obstruction or gangrene: Secondary | ICD-10-CM | POA: Insufficient documentation

## 2012-08-17 DIAGNOSIS — N940 Mittelschmerz: Secondary | ICD-10-CM | POA: Insufficient documentation

## 2012-08-17 DIAGNOSIS — M5430 Sciatica, unspecified side: Secondary | ICD-10-CM | POA: Insufficient documentation

## 2012-08-17 DIAGNOSIS — M7918 Myalgia, other site: Secondary | ICD-10-CM

## 2012-08-17 LAB — URINALYSIS, ROUTINE W REFLEX MICROSCOPIC
Bilirubin Urine: NEGATIVE
Glucose, UA: NEGATIVE mg/dL
Ketones, ur: 15 mg/dL — AB
Leukocytes, UA: NEGATIVE
Protein, ur: NEGATIVE mg/dL

## 2012-08-17 LAB — CBC
Hemoglobin: 13.1 g/dL (ref 12.0–15.0)
MCH: 29.1 pg (ref 26.0–34.0)
Platelets: 252 10*3/uL (ref 150–400)
RBC: 4.5 MIL/uL (ref 3.87–5.11)
WBC: 9.1 10*3/uL (ref 4.0–10.5)

## 2012-08-17 MED ORDER — KETOROLAC TROMETHAMINE 60 MG/2ML IM SOLN: 60.0000 mg | Freq: Once | INTRAMUSCULAR | Status: AC | PRN

## 2012-08-17 NOTE — MAU Note (Signed)
Pt c/o abd pain in LLQ and radiates down to her leg. Pain gets worse if she lifts or strains. Has been and on going problem.

## 2012-08-17 NOTE — ED Notes (Signed)
Pt c/o lower abd pain onset >1 yr... Pain is intermittent... Pain increases w/heavy lifting... Denies: f/v/n/d, constipation... She is alert and oriented w/no signs of acute distress.

## 2012-08-17 NOTE — MAU Note (Addendum)
Charted on wrong pt.

## 2012-08-17 NOTE — ED Provider Notes (Addendum)
   History    CSN: [id number] Arrival date & time 08/17/12  1535  None    Chief Complaint  Patient presents with  . Abdominal Pain   (Consider location/radiation/quality/duration/timing/severity/associated sxs/prior Treatment) Patient is a 47 y.o. female presenting with abdominal pain. The history is provided by the patient.  Abdominal Pain This is a new problem. The current episode started more than 2 days ago. The problem has been gradually worsening. Associated symptoms include abdominal pain.   Past Medical History  Diagnosis Date  . Dysphagia    Past Surgical History  Procedure Laterality Date  . Esophagogastroduodenoscopy  03/25/2011    Procedure: ESOPHAGOGASTRODUODENOSCOPY (EGD);  Surgeon: Freddy Jaksch, MD;  Location: [facility];  Service: Endoscopy;  Laterality: N/A;   Family History  Problem Relation Age of Onset  . Diabetes Mother   . Hypertension Mother   . Pancreatic cancer Father    History  Substance Use Topics  . Smoking status: Never Smoker   . Smokeless tobacco: Not on file  . Alcohol Use: No   OB History   Grav Para Term Preterm Abortions TAB SAB Ect Mult Living   4 3   1  1   3      Review of Systems  Constitutional: Negative.   Gastrointestinal: Positive for abdominal pain. Negative for nausea, vomiting, diarrhea and constipation.  Genitourinary: Positive for pelvic pain. Negative for dysuria, urgency, flank pain and menstrual problem.  Musculoskeletal: Negative for back pain.  Skin: Negative.     Allergies  Review of patient's allergies indicates no known allergies.  Home Medications   Current Outpatient Rx  Name  Route  Sig  Dispense  Refill  . cyclobenzaprine (FLEXERIL) 10 MG tablet   Oral   Take 0.5-1 tablets (5-10 mg total) by mouth 3 (three) times daily as needed for muscle spasms.   30 tablet   0    BP 102/74  Pulse 70  Temp(Src) 97.4 F (36.3 C) (Oral)  Resp 14  SpO2 99%  LMP 08/03/2012 Physical Exam  Nursing note  and vitals reviewed. Constitutional: She is oriented to person, place, and time. She appears well-developed and well-nourished.  HENT:  Head: Normocephalic.  Eyes: Conjunctivae are normal. Pupils are equal, round, and reactive to light.  Neck: Normal range of motion. Neck supple.  Pulmonary/Chest: Effort normal and breath sounds normal.  Abdominal: Soft. Bowel sounds are normal. She exhibits no distension and no mass. There is tenderness. There is no rebound, no guarding and no CVA tenderness. No hernia.    Musculoskeletal: Normal range of motion.  Neurological: She is alert and oriented to person, place, and time.  Skin: Skin is warm and dry.    ED Course  Procedures (including critical care time) Labs Reviewed - No data to display No results found. 1. Abdominal pain, acute, left lower quadrant     MDM    Linna Hoff, MD 08/17/12 1730  Linna Hoff, MD 08/30/12 1134

## 2012-08-18 DIAGNOSIS — N940 Mittelschmerz: Secondary | ICD-10-CM

## 2012-08-18 LAB — WET PREP, GENITAL: Clue Cells Wet Prep HPF POC: NONE SEEN

## 2012-08-18 MED ORDER — CYCLOBENZAPRINE HCL 10 MG PO TABS: 5.0000 mg | ORAL_TABLET | Freq: Three times a day (TID) | ORAL | Status: DC | PRN

## 2012-08-18 MED ORDER — CYCLOBENZAPRINE HCL 10 MG PO TABS
10.0000 mg | ORAL_TABLET | Freq: Once | ORAL | Status: AC
  Administered 2012-08-18: 10 mg via ORAL
  Filled 2012-08-18: qty 1

## 2012-08-18 NOTE — MAU Provider Note (Signed)
Chief Complaint: Abdominal Pain   First Provider Initiated Contact with Patient 08/18/12 0004     SUBJECTIVE HPI: Brittany Henry is a 47 y.o. [id number] female, LMP 08/03/12 who presents with LLQ pain x ~1 week, w/ Hx of similar pain intermittently in past. No Dx. Pain worse w/ mvmt. Has not tried anything else to Tx pain. 8/10 on pain scale. She states she is in a mutually monogamous relationship.  Also reports long Hx of pain in left buttock radiating down leg and painless mass above umbilicus that becomes palpable w/ sitting up. Has not had these complaints evaluated by PCP.    Past Medical History  Diagnosis Date  . Dysphagia    OB History   Grav Para Term Preterm Abortions TAB SAB Ect Mult Living   4 3   1  1   3      # Outc Date GA Lbr Len/2nd Wgt Sex Del Anes PTL Lv   1 PAR            2 PAR            3 PAR            4 SAB              Past Surgical History  Procedure Laterality Date  . Esophagogastroduodenoscopy  03/25/2011    Procedure: ESOPHAGOGASTRODUODENOSCOPY (EGD);  Surgeon: Freddy Jaksch, MD;  Location: [facility];  Service: Endoscopy;  Laterality: N/A;   History   Social History  . Marital Status: Married    Spouse Name: N/A    Number of Children: N/A  . Years of Education: N/A   Occupational History  . Not on file.   Social History Main Topics  . Smoking status: Never Smoker   . Smokeless tobacco: Not on file  . Alcohol Use: No  . Drug Use: No  . Sexually Active: Yes    Birth Control/ Protection: Condom   Other Topics Concern  . Not on file   Social History Narrative  . No narrative on file   No current facility-administered medications on file prior to encounter.   No current outpatient prescriptions on file prior to encounter.   No Known Allergies  ROS: Pertinent positive items in HPI. Neg for fever, chills, N/V/D/C, intermenstrual bleeding, dyspareunia, vaginal discharge, urinary complaints, loss of appetite or blood in stools.    OBJECTIVE Blood pressure 129/70, pulse 59, temperature 97 F (36.1 C), temperature source Oral, resp. rate 20, height 5\' 4"  (1.626 m), weight 80.74 kg (178 lb), last menstrual period 08/03/2012, SpO2 100.00%. GENERAL: Well-developed, well-nourished female in no acute distress.  HEENT: Normocephalic HEART: normal rate RESP: normal effort ABDOMEN: Soft, obese, mild LLQ tenderness. Neg LLQ mass. 2 x 2 cm compressible, mobile mass palpable on deep palpation above umbilicus during valsalva maneuver. Pos BS x 4.  BACK. No CVAT. Mild left low back and buttock tenderness. Normal ROM.  EXTREMITIES: Nontender, no edema NEURO: Alert and oriented SPECULUM EXAM: NEFG, physiologic discharge, no blood noted, cervix clean BIMANUAL: Cervix closed. Uterus normal size, no adnexal tenderness or masses. No CMT.  LAB RESULTS Results for orders placed during the hospital encounter of 08/17/12 (from the past 24 hour(s))  URINALYSIS, ROUTINE W REFLEX MICROSCOPIC     Status: Abnormal   Collection Time    08/17/12  7:35 PM      Result Value Range   Color, Urine YELLOW  YELLOW   APPearance CLEAR  CLEAR  Specific Gravity, Urine 1.025  1.005 - 1.030   pH 6.5  5.0 - 8.0   Glucose, UA NEGATIVE  NEGATIVE mg/dL   Hgb urine dipstick NEGATIVE  NEGATIVE   Bilirubin Urine NEGATIVE  NEGATIVE   Ketones, ur 15 (*) NEGATIVE mg/dL   Protein, ur NEGATIVE  NEGATIVE mg/dL   Urobilinogen, UA 0.2  0.0 - 1.0 mg/dL   Nitrite NEGATIVE  NEGATIVE   Leukocytes, UA NEGATIVE  NEGATIVE  POCT PREGNANCY, URINE     Status: None   Collection Time    08/17/12  7:54 PM      Result Value Range   Preg Test, Ur NEGATIVE  NEGATIVE  CBC     Status: None   Collection Time    08/17/12 11:20 PM      Result Value Range   WBC 9.1  4.0 - 10.5 K/uL   RBC 4.50  3.87 - 5.11 MIL/uL   Hemoglobin 13.1  12.0 - 15.0 g/dL   HCT 40.9  81.1 - 91.4 %   MCV 86.0  78.0 - 100.0 fL   MCH 29.1  26.0 - 34.0 pg   MCHC 33.9  30.0 - 36.0 g/dL   RDW 78.2   95.6 - 21.3 %   Platelets 252  150 - 400 K/uL  WET PREP, GENITAL     Status: Abnormal   Collection Time    08/18/12 12:10 AM      Result Value Range   Yeast Wet Prep HPF POC NONE SEEN  NONE SEEN   Trich, Wet Prep NONE SEEN  NONE SEEN   Clue Cells Wet Prep HPF POC NONE SEEN  NONE SEEN   WBC, Wet Prep HPF POC RARE (*) NONE SEEN    IMAGING US Transvaginal Non-ob  08/17/2012   *RADIOLOGY REPORT*  Clinical Data: Left lower quadrant pain.  TRANSABDOMINAL AND TRANSVAGINAL ULTRASOUND OF PELVIS  Technique:  Both transabdominal and transvaginal ultrasound examinations of the pelvis were performed including evaluation of the uterus, ovaries, adnexal regions, and pelvic cul-de-sac.  Comparison: 02/10/2010  Findings:  Uterus: 11.0 x 5.6 x 7.2 cm.  Previously seen focal fibroids not appreciated on today's study.  Diffuse heterogeneous echotexture throughout the uterus.  Endometrium: Borderline thickened at 16 mm with a normal appearance otherwise.  Right Ovary: Not visualized.  No adnexal masses.  Left Ovary: 3.5 x 1.8 x 2.0 cm. Normal size and echotexture.  No adnexal masses.  Other Findings:  No free fluid.  IMPRESSION: Previously reported fibroids not visualized today.  Mildly prominent, heterogeneous uterus.  Prominent endometrial stripe which is on otherwise unremarkable, likely related to menstrual phase.   Original Report Authenticated By: Charlett Nose, M.D.   US Pelvis Complete  08/17/2012   *RADIOLOGY REPORT*  Clinical Data: Left lower quadrant pain.  TRANSABDOMINAL AND TRANSVAGINAL ULTRASOUND OF PELVIS  Technique:  Both transabdominal and transvaginal ultrasound examinations of the pelvis were performed including evaluation of the uterus, ovaries, adnexal regions, and pelvic cul-de-sac.  Comparison: 02/10/2010  Findings:  Uterus: 11.0 x 5.6 x 7.2 cm.  Previously seen focal fibroids not appreciated on today's study.  Diffuse heterogeneous echotexture throughout the uterus.  Endometrium: Borderline  thickened at 16 mm with a normal appearance otherwise.  Right Ovary: Not visualized.  No adnexal masses.  Left Ovary: 3.5 x 1.8 x 2.0 cm. Normal size and echotexture.  No adnexal masses.  Other Findings:  No free fluid.  IMPRESSION: Previously reported fibroids not visualized today.  Mildly prominent, heterogeneous uterus.  Prominent endometrial stripe which is on otherwise unremarkable, likely related to menstrual phase.   Original Report Authenticated By: Charlett Nose, M.D.    MAU COURSE Pain resolved w/ Flexeril.   ASSESSMENT 1. Possible Mittelschmerz based on timing.   2. Musculoskeletal pain   3. Sciatica, left   4. Umbilical hernia without obstruction or gangrene     PLAN Discharge home in stable condition. (No NSAIDS per GI MD per pt) Comfort measures. Declined GC/CT testing. Hernia precautions.     Follow-up Information   Follow up with Primary care provider. (if no improvment in 3 days)       Follow up with [facility]. (As needed emergencies)    Contact information:   [address] [place] [address]        Medication List         cyclobenzaprine 10 MG tablet  Commonly known as:  FLEXERIL  Take 0.5-1 tablets (5-10 mg total) by mouth 3 (three) times daily as needed for muscle spasms.       [place], [place] 08/18/2012  12:39 AM

## 2012-08-30 NOTE — MAU Provider Note (Signed)
Attestation of Attending Supervision of Advanced Practitioner: Evaluation and management procedures were performed by the PA/NP/CNM/OB Fellow under my supervision/collaboration. Chart reviewed and agree with management and plan.  Devontre Siedschlag V 08/30/2012 6:15 PM

## 2013-11-09 ENCOUNTER — Ambulatory Visit: Payer: Self-pay

## 2013-11-09 ENCOUNTER — Other Ambulatory Visit: Payer: Self-pay | Admitting: Occupational Medicine

## 2013-11-09 DIAGNOSIS — R52 Pain, unspecified: Secondary | ICD-10-CM

## 2013-11-16 ENCOUNTER — Other Ambulatory Visit: Payer: Self-pay | Admitting: Occupational Medicine

## 2013-11-16 ENCOUNTER — Ambulatory Visit: Payer: Self-pay

## 2013-11-16 DIAGNOSIS — M545 Low back pain, unspecified: Secondary | ICD-10-CM

## 2013-12-25 ENCOUNTER — Encounter (HOSPITAL_COMMUNITY): Payer: Self-pay | Admitting: *Deleted

## 2014-04-03 ENCOUNTER — Other Ambulatory Visit: Payer: Self-pay | Admitting: *Deleted

## 2014-04-03 DIAGNOSIS — I83813 Varicose veins of bilateral lower extremities with pain: Secondary | ICD-10-CM

## 2014-06-06 ENCOUNTER — Encounter: Payer: Self-pay | Admitting: Vascular Surgery

## 2014-06-07 ENCOUNTER — Encounter: Payer: Self-pay | Admitting: Vascular Surgery

## 2014-06-07 ENCOUNTER — Ambulatory Visit (HOSPITAL_COMMUNITY)
Admission: RE | Admit: 2014-06-07 | Discharge: 2014-06-07 | Disposition: A | Discharge status: Home / Self Care | Payer: BC Managed Care – PPO | Source: Ambulatory Visit | Attending: Vascular Surgery | Admitting: Vascular Surgery

## 2014-06-07 ENCOUNTER — Ambulatory Visit (INDEPENDENT_AMBULATORY_CARE_PROVIDER_SITE_OTHER): Payer: BC Managed Care – PPO | Admitting: Vascular Surgery

## 2014-06-07 VITALS — BP 111/88 | HR 68 | Ht 64.0 in | Wt 179.7 lb

## 2014-06-07 DIAGNOSIS — I83813 Varicose veins of bilateral lower extremities with pain: Secondary | ICD-10-CM | POA: Insufficient documentation

## 2014-06-07 NOTE — Progress Notes (Signed)
[facility] HISTORY AND PHYSICAL   History of Present Illness:  Patient is a 49 y.o. year old female who presents for evaluation of painful varicose veins. The patient does not speak [place] and interview was conducted through an interpreter today. She complains of pain primarily at the back of her left leg near the knee and right lateral knee. She states she has heaviness and aching in her lower extremities that progresses as the day goes on. She has had this problem for 15 years that has been slowly worsening. She does have a family history of varicose veins in her mother. He denies any prior history of DVT. She has no current or previous ulcerations or wounds on her legs. She has not worn compression therapy in the past..  Other medical problems include dysphagia which is controlled.  Past Medical History  Diagnosis Date  . Dysphagia     Past Surgical History  Procedure Laterality Date  . Esophagogastroduodenoscopy  03/25/2011    Procedure: ESOPHAGOGASTRODUODENOSCOPY (EGD);  Surgeon: Freddy Jaksch, MD;  Location: [facility];  Service: Endoscopy;  Laterality: N/A;    Social History History  Substance Use Topics  . Smoking status: Never Smoker   . Smokeless tobacco: Not on file  . Alcohol Use: No    Family History Family History  Problem Relation Age of Onset  . Diabetes Mother   . Hypertension Mother   . Varicose Veins Mother   . Pancreatic cancer Father   . Cancer Father     Allergies  No Known Allergies   Current Outpatient Prescriptions  Medication Sig Dispense Refill  . cyclobenzaprine (FLEXERIL) 10 MG tablet Take 0.5-1 tablets (5-10 mg total) by mouth 3 (three) times daily as needed for muscle spasms. 30 tablet 0   No current facility-administered medications for this visit.    ROS:   General:  No weight loss, Fever, chills  HEENT: No recent headaches, no nasal bleeding, no visual changes, no sore throat  Neurologic: No  dizziness, blackouts, seizures. No recent symptoms of stroke or mini- stroke. No recent episodes of slurred speech, or temporary blindness.  Cardiac: No recent episodes of chest pain/pressure, no shortness of breath at rest.  No shortness of breath with exertion.  Denies history of atrial fibrillation or irregular heartbeat  Vascular: No history of rest pain in feet.  No history of claudication.  No history of non-healing ulcer, No history of DVT   Pulmonary: No home oxygen, no productive cough, no hemoptysis,  No asthma or wheezing  Musculoskeletal:   Arthritis,  Low back pain,   Joint pain  Hematologic:No history of hypercoagulable state.  No history of easy bleeding.  No history of anemia  Gastrointestinal: No hematochezia or melena,  No gastroesophageal reflux, no trouble swallowing  Urinary:  chronic Kidney disease,  on HD -  MWF or  TTHS,  Burning with urination,  Frequent urination,  Difficulty urinating;   Skin: No rashes  Psychological: No history of anxiety,  No history of depression   Physical Examination  Filed Vitals:   06/07/14 1123  BP: 111/88  Pulse: 68  Height:  (1.626 m)  Weight: 179 lb 11.2 oz (81.511 kg)  SpO2: 97%    Body mass index is 30.83 kg/(m^2).  General:  Alert and oriented, no acute distress HEENT: Normal Neck: No bruit or JVD Pulmonary: Clear to auscultation bilaterally Cardiac: Regular Rate and Rhythm  without murmur Abdomen: Soft, non-tender, non-distended, no mass, no scars Skin: No rash, scattered reticular type varicosities primary clusters along the anterior right thigh and the left posterior calf all of these veins are around 3 mm in diameter. Extremity Pulses:  2+ radial, brachial, femoral, dorsalis pedis pulses bilaterally Musculoskeletal: No deformity trace edema  Neurologic: Upper and lower extremity motor 5/5 and symmetric  DATA:  Patient had bilateral lower extremity venous duplex exam today.  This shows evidence of reflux in the left proximal and mid greater saphenous vein with diameter 4-5 mm. She also had some reflux in the left common femoral vein. On the right leg she had reflux in the right lesser saphenous vein 4-7 mm diameter.   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 She has pain in both legs.   PLAN:  Patient was given a prescription today for bilateral thigh-high compression stockings and a brochure for elastic therapy. She will follow-up in 3 months to consider laser ablation.  Fabienne Brunsharles Fields, MD [facility] [place] Office: [phone] Pager: [phone]

## 2014-09-11 ENCOUNTER — Ambulatory Visit: Payer: Self-pay | Admitting: Vascular Surgery

## 2014-11-15 ENCOUNTER — Ambulatory Visit: Payer: Self-pay

## 2014-11-15 ENCOUNTER — Other Ambulatory Visit: Payer: Self-pay | Admitting: Occupational Medicine

## 2014-11-15 DIAGNOSIS — M545 Low back pain: Secondary | ICD-10-CM

## 2015-06-24 ENCOUNTER — Other Ambulatory Visit: Payer: Self-pay | Admitting: Obstetrics and Gynecology

## 2015-06-24 DIAGNOSIS — R1031 Right lower quadrant pain: Secondary | ICD-10-CM

## 2015-06-24 DIAGNOSIS — R1032 Left lower quadrant pain: Secondary | ICD-10-CM

## 2015-07-01 ENCOUNTER — Ambulatory Visit
Admission: RE | Admit: 2015-07-01 | Discharge: 2015-07-01 | Disposition: A | Discharge status: Home / Self Care | Payer: BC Managed Care – PPO | Source: Ambulatory Visit | Attending: Obstetrics and Gynecology | Admitting: Obstetrics and Gynecology

## 2015-07-01 DIAGNOSIS — R1031 Right lower quadrant pain: Secondary | ICD-10-CM

## 2015-07-01 DIAGNOSIS — R1032 Left lower quadrant pain: Secondary | ICD-10-CM

## 2015-07-14 ENCOUNTER — Encounter (HOSPITAL_COMMUNITY): Payer: Self-pay | Admitting: Family Medicine

## 2015-07-14 ENCOUNTER — Emergency Department (HOSPITAL_COMMUNITY)
Admission: EM | Admit: 2015-07-14 | Discharge: 2015-07-14 | Disposition: A | Discharge status: Home / Self Care | Payer: BC Managed Care – PPO | Attending: Emergency Medicine | Admitting: Emergency Medicine

## 2015-07-14 DIAGNOSIS — R1010 Upper abdominal pain, unspecified: Secondary | ICD-10-CM | POA: Diagnosis present

## 2015-07-14 DIAGNOSIS — R1084 Generalized abdominal pain: Secondary | ICD-10-CM | POA: Insufficient documentation

## 2015-07-14 DIAGNOSIS — R112 Nausea with vomiting, unspecified: Secondary | ICD-10-CM | POA: Diagnosis not present

## 2015-07-14 LAB — COMPREHENSIVE METABOLIC PANEL
ALBUMIN: 4 g/dL (ref 3.5–5.0)
ALK PHOS: 42 U/L (ref 38–126)
ALT: 20 U/L (ref 14–54)
AST: 21 U/L (ref 15–41)
Anion gap: 8 (ref 5–15)
BUN: <5 mg/dL — ABNORMAL LOW (ref 6–20)
CALCIUM: 9.4 mg/dL (ref 8.9–10.3)
CO2: 24 mmol/L (ref 22–32)
CREATININE: 0.63 mg/dL (ref 0.44–1.00)
Chloride: 103 mmol/L (ref 101–111)
GFR calc Af Amer: >60 mL/min (ref >60)
GFR calc non Af Amer: >60 mL/min (ref >60)
GLUCOSE: 212 mg/dL — AB (ref 65–99)
Potassium: 3.4 mmol/L — ABNORMAL LOW (ref 3.5–5.1)
SODIUM: 135 mmol/L (ref 135–145)
Total Bilirubin: 0.5 mg/dL (ref 0.3–1.2)
Total Protein: 7.7 g/dL (ref 6.5–8.1)

## 2015-07-14 LAB — URINALYSIS, ROUTINE W REFLEX MICROSCOPIC
BILIRUBIN URINE: NEGATIVE
Glucose, UA: NEGATIVE mg/dL
HGB URINE DIPSTICK: NEGATIVE
Ketones, ur: NEGATIVE mg/dL
Leukocytes, UA: NEGATIVE
Nitrite: NEGATIVE
PROTEIN: NEGATIVE mg/dL
SPECIFIC GRAVITY, URINE: 1.005 (ref 1.005–1.030)
pH: 6 (ref 5.0–8.0)

## 2015-07-14 LAB — LIPASE, BLOOD: Lipase: 21 U/L (ref 11–51)

## 2015-07-14 LAB — CBC
HCT: 40.7 % (ref 36.0–46.0)
Hemoglobin: 13.6 g/dL (ref 12.0–15.0)
MCH: 28.5 pg (ref 26.0–34.0)
MCHC: 33.4 g/dL (ref 30.0–36.0)
MCV: 85.1 fL (ref 78.0–100.0)
PLATELETS: 230 10*3/uL (ref 150–400)
RBC: 4.78 MIL/uL (ref 3.87–5.11)
RDW: 12.8 % (ref 11.5–15.5)
WBC: 8.1 10*3/uL (ref 4.0–10.5)

## 2015-07-14 MED ORDER — MORPHINE SULFATE (PF) 4 MG/ML IV SOLN
4.0000 mg | Freq: Once | INTRAVENOUS | Status: AC
  Administered 2015-07-14: 4 mg via INTRAVENOUS
  Filled 2015-07-14: qty 1

## 2015-07-14 MED ORDER — SUCRALFATE 1 G PO TABS: 1.0000 g | ORAL_TABLET | Freq: Three times a day (TID) | ORAL | Status: DC

## 2015-07-14 MED ORDER — GI COCKTAIL ~~LOC~~
30.0000 mL | Freq: Once | ORAL | Status: AC
  Administered 2015-07-14: 30 mL via ORAL
  Filled 2015-07-14: qty 30

## 2015-07-14 MED ORDER — OXYCODONE-ACETAMINOPHEN 5-325 MG PO TABS: 1.0000 | ORAL_TABLET | ORAL | Status: DC | PRN

## 2015-07-14 MED ORDER — SODIUM CHLORIDE 0.9 % IV BOLUS (SEPSIS)
1000.0000 mL | Freq: Once | INTRAVENOUS | Status: AC
  Administered 2015-07-14: 1000 mL via INTRAVENOUS

## 2015-07-14 MED ORDER — ONDANSETRON HCL 4 MG/2ML IJ SOLN
4.0000 mg | Freq: Once | INTRAMUSCULAR | Status: AC
  Administered 2015-07-14: 4 mg via INTRAVENOUS
  Filled 2015-07-14: qty 2

## 2015-07-14 MED ORDER — PANTOPRAZOLE SODIUM 20 MG PO TBEC: 20.0000 mg | DELAYED_RELEASE_TABLET | Freq: Every day | ORAL | Status: DC

## 2015-07-14 NOTE — ED Notes (Signed)
Pt here for upper abd pain with N,V. sts pain has been intermittent for months but worsening over the last few weeks. She has had US that confirmed gallstones. sts the pain radiates into her lower abdomen also.

## 2015-07-14 NOTE — ED Provider Notes (Signed)
CSN: [id number]     Arrival date & time 07/14/15  1344 History   First MD Initiated Contact with Patient 07/14/15 1408     Chief Complaint  Patient presents with  . Abdominal Pain     (Consider location/radiation/quality/duration/timing/severity/associated sxs/prior Treatment) HPI Brittany Henry is a 50 y.o. female presents to emergency department complaining of abdominal pain. Pt states her pain has been there on and off for several months. States it worsened in the last 3 weeks. Reports it is unbearable in the last few days. Reports associated nausea and vomiting. Denies diarrhea. Denies urinary symptoms. Pt states pain is mainly in the upper abdomen, states radiates around to the right flank and also around the entire abdomen. Patient was seen by her OB/GYN and initially was worked up for pelvic etiology and was negative. Her OB/GYN then ordered a full abdominal ultrasound which showed gallstones. This was done 2 weeks ago. She was referred to surgery has an appointment with them tomorrow. Patient is currently taking Prevacid Tylenol for pain which is not helping. She also states that about 3 years ago she was diagnosed with gastritis and small gastric ulcer. Patient is unsure of that is getting worse.  Past Medical History  Diagnosis Date  . Dysphagia    Past Surgical History  Procedure Laterality Date  . Esophagogastroduodenoscopy  03/25/2011    Procedure: ESOPHAGOGASTRODUODENOSCOPY (EGD);  Surgeon: Freddy JakschWilliam M Outlaw, MD;  [facility];  Service: Endoscopy;  Laterality: N/A;   Family History  Problem Relation Age of Onset  . Diabetes Mother   . Hypertension Mother   . Varicose Veins Mother   . Pancreatic cancer Father   . Cancer Father    Social History  Substance Use Topics  . Smoking status: Never Smoker   . Smokeless tobacco: None  . Alcohol Use: No   OB History    Gravida Para Term Preterm AB TAB SAB Ectopic Multiple Living   4 3   1  1   3      Review of  Systems  Constitutional: Negative for fever and chills.  Respiratory: Negative for cough, chest tightness and shortness of breath.   Cardiovascular: Negative for chest pain, palpitations and leg swelling.  Gastrointestinal: Positive for nausea, vomiting and abdominal pain. Negative for diarrhea.  Genitourinary: Negative for dysuria, flank pain and pelvic pain.  Musculoskeletal: Negative for myalgias, arthralgias, neck pain and neck stiffness.  Skin: Negative for rash.  Neurological: Negative for dizziness, weakness and headaches.  All other systems reviewed and are negative.     Allergies  Review of patient's allergies indicates no known allergies.  Home Medications   Prior to Admission medications   Medication Sig Start Date End Date Taking? Authorizing Provider  cyclobenzaprine (FLEXERIL) 10 MG tablet Take 0.5-1 tablets (5-10 mg total) by mouth 3 (three) times daily as needed for muscle spasms. 08/18/12   Koleen NimrodVirginia Smith, CNM   BP 126/80 mmHg  Pulse 79  Temp(Src) 98.1 F (36.7 C) (Oral)  Resp 18  Ht 5\' 5"  (1.651 m)  Wt 71.215 kg  BMI 26.13 kg/m2  SpO2 96%  LMP 10/25/2014 Physical Exam  Constitutional: She is oriented to person, place, and time. She appears well-developed and well-nourished.  Patient is tearful, appears to be in a lot of pain.  HENT:  Head: Normocephalic.  Eyes: Conjunctivae are normal.  Neck: Neck supple.  Cardiovascular: Normal rate, regular rhythm and normal heart sounds.   Pulmonary/Chest: Effort normal and breath sounds normal. No  respiratory distress. She has no wheezes. She has no rales.  Abdominal: Soft. Bowel sounds are normal. She exhibits no distension. There is tenderness. There is no rebound.  Diffuse abdominal tenderness, worse in the right upper quadrant  Musculoskeletal: She exhibits no edema.  Neurological: She is alert and oriented to person, place, and time.  Skin: Skin is warm and dry.  Psychiatric: She has a normal mood and affect.  Her behavior is normal.  Nursing note and vitals reviewed.   ED Course  Procedures (including critical care time) Labs Review Labs Reviewed  COMPREHENSIVE METABOLIC PANEL - Abnormal; Notable for the following:    Potassium 3.4 (*)    Glucose, Bld 212 (*)    BUN <5 (*)    All other components within normal limits  LIPASE, BLOOD  CBC  URINALYSIS, ROUTINE W REFLEX MICROSCOPIC (NOT AT [facility])  PREGNANCY, URINE    Imaging Review No results found. I have personally reviewed and evaluated these images and lab results as part of my medical decision-making.   EKG Interpretation None      MDM   Final diagnoses:  Pain of upper abdomen   Patient with worsening abdominal pain over last several months. Ultrasound from 2 weeks ago showed cholelithiasis. She is tearful. Pain is diffuse over her entire abdomen but worse in the right upper quadrant. Will check labs, urinalysis, will order medications for pain and antiemetics.  3:44 PM Pain is much better. Unable to perform ultrasound, patient just had some food and hour prior to coming in. At this point I'm not injure percent convinced her pain is due to cholelithiasis. Patient reports that she has had some burning in her throat and pain is worse when she is laying down at night. It is most consistent with acid reflux versus gastritis versus peptic ulcer disease. At this time, patient is stable for discharge home. I will start her on Carafate and Pepcid. I will also prescribe her a few pain medications to help with pain. She has an appointment with surgery tomorrow. Will follow-up with them tomorrow as scheduled. Patient's blood sugar was found to be 212. Emergency department. I discussed this with patient and she is not a diabetic, she will need to follow-up with her doctor. Discussed diet choices for both gallbladder disease and for gert and hyperglycemia.   Filed Vitals:   07/14/15 1353 07/14/15 1500 07/14/15 1530  BP: 126/80 111/79 120/89   Pulse: 79 65 58  Temp: 98.1 F (36.7 C)    TempSrc: Oral    Resp: Height:  (1.651 m)    Weight: 71.215 kg    SpO2: 96% 99% 97%     Jaynie Crumble, PA-C 07/14/15 1551  Gerhard Munch, MD 07/15/15 1611

## 2015-07-14 NOTE — Discharge Instructions (Signed)
Take carafate as prescribed. Take protonix as prescribed. Take percocet for severe pain as prescribed as needed. Follow up with surgery as scheduled tomorrow.   Enfermedad por reflujo gastroesofgico en los adultos (Gastroesophageal Reflux Disease, Adult) Normalmente, los alimentos descienden por el esfago y se depositan en el estmago para su digestin. Sin embargo, cuando una persona tiene enfermedad por reflujo gastroesofgico (ERGE), los alimentos y el cido estomacal regresan al esfago. Cuando esto ocurre, el esfago se irrita y se inflama. Con el tiempo, la ERGE puede provocar la formacin de pequeas perforaciones (lceras) en la mucosa del esfago.  CAUSAS Un problema del msculo que se encuentra entre el esfago y Investment banker, corporateel estmago (esfnter esofgico inferior o EEI) es la causa de esta enfermedad. Por lo general, el esfnter esofgico inferior se cierra despus de que los alimentos pasan a travs del esfago hacia el Crowheartestmago. Cuando el EEI est debilitado o no es normal, no se cierra correctamente, lo que permite el paso retrgrado de los alimentos y el cido estomacal al esfago. Algunas sustancias de la dieta, algunos medicamentos y Materials engineerciertas enfermedades pueden debilitar este esfnter, entre ellos:  Consumo de tabaco.  [place] EstatesEmbarazo.  Hernia de hiato.  Consumo excesivo de alcohol.  Algunos alimentos y [address]ertas bebidas, como el caf, el chocolate, las cebollas y Interior and spatial designerla menta. FACTORES DE RIESGO Es ms probable que esta afeccin se manifieste en:  Los personas con sobrepeso.  Las personas con trastornos del tejido conjuntivo.  Las personas que toman antiinflamatorios no esteroides (AINE). SNTOMAS Los sntomas de esta afeccin incluyen lo siguiente:  Merchant navy officerAcidez estomacal.  Dificultad o dolor al tragar.  Sensacin de Warehouse managertener un bulto en la garganta.  Sabor amargo en la boca.  Mal aliento.  Gran cantidad de saliva.  Malestar estomacal o meteorismo.  Flatulencias.  Dolor en el  pecho.  Falta de aire o sibilancias.  Tos permanente (crnica) o tos nocturna.  Desgaste el esmalte dental.  Prdida de peso. El dolor en el pecho puede deberse a muchas afecciones diferentes. Consulte al mdico si tiene Journalist, newspaperdolor en el pecho. DIAGNSTICO El mdico le har una historia clnica y un examen fsico. Para determinar si la ERGE es leve o grave, el mdico tambin puede controlar la respuesta al Glen Campbelltratamiento. Tambin pueden hacerle otros estudios, por ejemplo:  Una endoscopia para examinar el estmago y el esfago con Neomia Dearuna pequea cmara.  Un estudio que determina el nivel de acidez en el esfago.  Un estudio que mide la presin que hay en el esfago.  Un estudio de deglucin de bario o un estudio modificado de deglucin de bario para mostrar la forma, el tamao y el funcionamiento del esfago. TRATAMIENTO El objetivo del tratamiento es aliviar los sntomas y Automotive engineerevitar las complicaciones. El tratamiento de esta afeccin puede variar en funcin de la gravedad de los sntomas. El mdico podr indicar lo siguiente:  Cambios en la dieta.  Medicamentos.  Ciruga. INSTRUCCIONES PARA EL CUIDADO EN EL HOGAR Dieta  Siga la dieta que le haya recomendado el mdico, la cual puede incluir evitar alimentos y bebidas tales como:  Caf y t (con o sin cafena).  Bebidas que contengan alcohol.  Bebidas energizantes y deportivas.  Gaseosas o refrescos.  Chocolate y cacao.  Menta y esencias de [address].  Ajo y cebollas.  Rbano picante.  Alimentos muy condimentados y cidos, entre ellos, pimientos, [place] en polvo, curry en polvo, vinagre, salsas picantes y [address]alsa barbacoa.  Frutas ctricas y sus jugos, como naranjas, limones y limas.  Alimentos a  base de tomates, como salsa roja, [place], salsa y pizza con salsa roja.  Alimentos fritos y Lexicographer, como rosquillas, papas fritas y aderezos con alto contenido de Holiday representative.  Carnes con alto contenido de [place], como hot dogs y cortes grasos de  carnes rojas y blancas, por ejemplo, filetes de entrecot, salchicha, jamn y tocino.  Productos lcteos con alto contenido de [place], como [place], [place] y queso crema.  Haga comidas pequeas y frecuentes Freight forwarder de comidas abundantes.  Evite beber mucho lquido con las comidas.  No coma durante las 2 o 3horas previas a la hora de [place].  No se acueste inmediatamente despus de comer.  No haga actividad fsica enseguida despus de comer. Instrucciones generales  Est atento a cualquier cambio en los sntomas.  Tome los medicamentos de venta libre y los recetados solamente como se lo haya indicado el mdico. No tome aspirina, ibuprofeno ni otros antiinflamatorios no esteroides (AINE), a menos que se lo haya indicado el mdico.  No consuma ningn producto que contenga tabaco, lo que incluye cigarrillos, tabaco de Theatre manager y Administrator, Civil Service. Si necesita ayuda para dejar de fumar, consulte al mdico.  Use ropas sueltas. No use prendas ajustadas alrededor de la cintura que ejerzan presin en el abdomen.  Levante (eleve) 6pulgadas (15centmetros) la cabecera de la cama.  Trate de reducir [employer] de estrs con actividades como el yoga o la meditacin. Si necesita ayuda para reducir [employer] de estrs, consulte al mdico.  Si tiene sobrepeso, Media planner un peso saludable. Hable con el mdico acerca de su peso ideal y pdale asesoramiento en cuanto a la dieta que debe seguir para Aeronautical engineer.  Concurra a todas las visitas de control como se lo haya indicado el mdico. Esto es importante. SOLICITE ATENCIN MDICA SI:  Aparecen nuevos sntomas.  Baja de peso sin causa aparente.  Tiene dificultad para tragar o siente dolor al [employer].  Tiene sibilancias o tos persistente.  Los sntomas no mejoran con Scientist, research (medical).  Tiene la voz ronca. SOLICITE ATENCIN MDICA DE [employer] SI:  Tiene dolor en los brazos, el cuello, los  Eleanor, la dentadura o la espalda.  Berenice Primas, se marea o tiene sensacin de desvanecimiento.  Siente falta de aire o Journalist, newspaper.  Vomita y el vmito es parecido a la sangre o a los granos de caf.  Se desmaya.  Las heces son sanguinolentas o de color negro.  No puede tragar, beber o comer.   Esta informacin no tiene Theme park manager el consejo del mdico. Asegrese de hacerle al mdico cualquier pregunta que tenga.   Document Released: 11/19/2004 Document Revised: 10/31/2014 Elsevier Interactive Patient Education 2016 [employer].  [employer] (Cholelithiasis) La colelitiasis (tambin llamada clculos en la vescula) es una enfermedad en la que se forman piedras en la vescula. La vescula es un rgano que almacena la bilis que se forma en el hgado y que ayuda a Engineer, agricultural. Los clculos comienzan como pequeos cristales y lentamente se transforman en piedras. El dolor en la vescula ocurre cuando se producen espasmos y los clculos obstruyen el conducto. El dolor tambin se produce cuando una piedra sale por el conducto.  FACTORES DE RIESGO  Ser mujer.   Tener embarazos mltiples. Algunas veces los mdicos aconsejan extirpar los clculos biliares antes de futuros embarazos.   Ser obeso.  Dietas que incluyan comidas fritas y grasas.   Ser mayor de 55 aos y el aumento de la edad.  El uso prolongado de medicamentos que contengan hormonas femeninas.   Tener diabetes mellitus.   Prdida rpida de peso.   Historia familiar de clculos (herencia).  SNTOMAS  Nuseas.   Vmitos.  Dolor abdominal.   Piel amarilla (ictericia)   Dolor sbito. Puede persistir desde algunos minutos hasta algunas horas.  Grant Ruts.   Sensibilidad al tacto. En algunos casos, cuando los clculos biliares no se mueven hacia el conducto biliar, las personas no sienten dolor ni presentan sntomas. Estos se denominan clculos "silenciosos".  TRATAMIENTO Los  clculos silenciosos no requieren TEFL teacher. En los [employer], podr ser [place]. Las opciones de tratamiento son:  Kandis Ban para extirpar la vescula. Es el tratamiento ms frecuente.  Medicamentos. No siempre dan resultado y pueden demorar entre 6 y 12 meses o ms en Scientist, water quality.  Tratamiento con ondas de choque (litotricia biliar extracorporal). En este tratamiento, una mquina de ultrasonido enva ondas de choque a la vescula para destruir los clculos en pequeos fragmentos que luego podrn pasar a los intestinos o ser disueltas con medicamentos. INSTRUCCIONES PARA EL CUIDADO EN EL HOGAR   Slo tome medicamentos de venta libre o recetados para Primary school teacher, Environmental health practitioner o bajar la fiebre, segn las indicaciones de su mdico.   Siga una dieta baja en grasas hasta que su mdico lo vea nuevamente. Las grasas hacen que la vescula se Technical sales engineer, lo que puede Engineer, agricultural.   Concurra a las consultas de control con su mdico segn las indicaciones. Los ataques casi siempre son recurrentes y generalmente habr que someterse a una ciruga como [place].  SOLICITE ATENCIN MDICA DE INMEDIATO SI:   El dolor aumenta y no puede controlarlo con los medicamentos.   Tiene fiebre o sntomas persistentes durante ms de 2 - 3 das.   Tiene fiebre y los sntomas empeoran repentinamente.   Tiene nuseas o vmitos persistentes.  ASEGRESE DE QUE:   Comprende estas instrucciones.  Controlar su afeccin.  Recibir ayuda de inmediato si no mejora o si empeora.   Esta informacin no tiene Theme park manager el consejo del mdico. Asegrese de hacerle al mdico cualquier pregunta que tenga.   Document Released: 11/26/2005 Document Revised: 10/12/2012 Elsevier Interactive Patient Education [employer]

## 2015-07-14 NOTE — ED Notes (Signed)
US cancelled. Pt to f/u with surgeon tomorrow

## 2015-07-14 NOTE — ED Notes (Signed)
MD at bedside. 

## 2015-07-15 ENCOUNTER — Ambulatory Visit: Payer: Self-pay | Admitting: General Surgery

## 2015-07-15 NOTE — H&P (Signed)
History of Present Illness (Carlyn Lemke MD; 07/15/2015 10:27 AM) The patient is a 50 year old female who presents for evaluation of gall stones. The patient is a 50-year-old Spanish-speaking female who is referred by Dr. John McComb for evaluation of symptomatic stones. Patient had a long history of abdominal pain, generalized, epigastric, right upper quadrant. Patient underwent ultrasound which revealed multiple stones. Patient states that she mainly has pain after eating fatty foods. Patient has been seen in the ER secondary to her right upper quadrant pain.  Patient works as a custodian at a local school   Other Problems (Sonya Bynum, CMA; 07/15/2015 9:54 AM) Anxiety Disorder Back Pain Bladder Problems Cholelithiasis Gastroesophageal Reflux Disease Inguinal Hernia  Past Surgical History (Sonya Bynum, CMA; 07/15/2015 9:54 AM) No pertinent past surgical history  Diagnostic Studies History (Sonya Bynum, CMA; 07/15/2015 9:54 AM) Colonoscopy never Mammogram within last year Pap Smear 1-5 years ago  Allergies (Sonya Bynum, CMA; 07/15/2015 9:55 AM) No Known Drug Allergies05/22/2017  Medication History (Sonya Bynum, CMA; 07/15/2015 9:55 AM) Carafate (1GM Tablet, Oral) Active. Pantoprazole Sodium (20MG Tablet DR, Oral) Active. Medications Reconciled  Social History (Sonya Bynum, CMA; 07/15/2015 9:54 AM) Alcohol use Occasional alcohol use. Caffeine use Carbonated beverages, Coffee, Tea. No drug use Tobacco use Never smoker.  Family History (Sonya Bynum, CMA; 07/15/2015 9:54 AM) Alcohol Abuse Brother. Cancer Mother. Depression Brother. Diabetes Mellitus Brother. Heart Disease Brother. Hypertension Mother. Malignant Neoplasm Of Pancreas Father.  Pregnancy / Birth History (Sonya Bynum, CMA; 07/15/2015 9:54 AM) Age at menarche 10 years. Age of menopause 46-50 Gravida 4 Maternal age 26-30 Para 3 Regular periods    Review of Systems (Rigoberto Repass MD; 07/15/2015 10:25 AM) General Present- Appetite Loss, Fatigue and Weight Loss. Not Present- Chills, Fever, Night Sweats and Weight Gain. Skin Present- Jaundice. Not Present- Change in Wart/Mole, Dryness, Hives, New Lesions, Non-Healing Wounds, Rash and Ulcer. HEENT Present- Yellow Eyes. Not Present- Earache, Hearing Loss, Hoarseness, Nose Bleed, Oral Ulcers, Ringing in the Ears, Seasonal Allergies, Sinus Pain, Sore Throat, Visual Disturbances and Wears glasses/contact lenses. Respiratory Not Present- Bloody sputum, Chronic Cough, Difficulty Breathing, Snoring and Wheezing. Breast Not Present- Breast Mass, Breast Pain, Nipple Discharge and Skin Changes. Cardiovascular Not Present- Chest Pain, Difficulty Breathing Lying Down, Leg Cramps, Palpitations, Rapid Heart Rate, Shortness of Breath and Swelling of Extremities. Gastrointestinal Present- Abdominal Pain, Constipation, Nausea and Vomiting. Not Present- Bloating, Bloody Stool, Change in Bowel Habits, Chronic diarrhea, Difficulty Swallowing, Excessive gas, Gets full quickly at meals, Hemorrhoids, Indigestion and Rectal Pain. Female Genitourinary Present- Frequency, Nocturia, Pelvic Pain and Urgency. Not Present- Painful Urination. Musculoskeletal Not Present- Myalgia. Neurological Present- Headaches. Not Present- Decreased Memory, Fainting, Numbness, Seizures, Tingling, Tremor, Trouble walking and Weakness. Psychiatric Present- Anxiety, Change in Sleep Pattern, Fearful and Frequent crying. Not Present- Bipolar and Depression. Endocrine Not Present- Cold Intolerance, Excessive Hunger, Hair Changes, Heat Intolerance, Hot flashes and New Diabetes. Hematology Not Present- Easy Bruising, Excessive bleeding, Gland problems, HIV and Persistent Infections.  Vitals (Sonya Bynum CMA; 07/15/2015 9:55 AM) 07/15/2015 9:54 AM Weight: 161 lb Height: 64in Body Surface Area: 1.78 m Body Mass Index: 27.64 kg/m  Temp.: 97.1F(Temporal)  Pulse: 79  (Regular)  BP: 124/80 (Sitting, Left Arm, Standard)       Physical Exam (Yulia Ulrich, MD; 07/15/2015 10:27 AM) General Mental Status-Alert. General Appearance-Consistent with stated age. Hydration-Well hydrated. Voice-Normal.  Head and Neck Head-normocephalic, atraumatic with no lesions or palpable masses.  Eye Eyeball - Bilateral-Extraocular movements intact. Sclera/Conjunctiva - Bilateral-No scleral   icterus.  Chest and Lung Exam Chest and lung exam reveals -quiet, even and easy respiratory effort with no use of accessory muscles. Inspection Chest Wall - Normal. Back - normal.  Cardiovascular Cardiovascular examination reveals -normal heart sounds, regular rate and rhythm with no murmurs.  Abdomen Inspection Normal Exam - No Hernias. Palpation/Percussion Normal exam - Soft, Non Tender, No Rebound tenderness, No Rigidity (guarding) and No hepatosplenomegaly. Auscultation Normal exam - Bowel sounds normal.  Neurologic Neurologic evaluation reveals -alert and oriented x 3 with no impairment of recent or remote memory. Mental Status-Normal.  Musculoskeletal Normal Exam - Left-Upper Extremity Strength Normal and Lower Extremity Strength Normal. Normal Exam - Right-Upper Extremity Strength Normal, Lower Extremity Weakness.    Assessment & Plan Axel Filler(Alvilda Mckenna MD; 07/15/2015 10:27 AM) SYMPTOMATIC CHOLELITHIASIS (K80.20) Impression: 50 year old female with symptomatic gallstones.  1. We will proceed to the operating room for a laparoscopic cholecystectomy 2. Risks and benefits were discussed with the patient to generally include, but not limited to: infection, bleeding, possible need for post op ERCP, damage to the bile ducts, bile leak, and possible need for further surgery. Alternatives were offered and described. All questions were answered and the patient voiced understanding of the procedure and wishes to proceed at this point with a  laparoscopic cholecystectomy

## 2015-07-17 NOTE — Patient Instructions (Addendum)
Murvin DonningMaria C Connolly  07/17/2015   Your procedure is scheduled on: Tuesday 07/23/2015  [facility]  [facility] floor to  Short Stay Center at  100 pm.  Call this number if you have problems the morning of surgery [phone]   Remember: ONLY 1 PERSON MAY GO WITH YOU TO SHORT STAY TO GET  READY MORNING OF YOUR SURGERY.   Do not eat food or drink liquids :After Midnight.     Take these medicines the morning of surgery with A SIP OF WATER: omeprazole, zyrtec if needed                                You may not have any metal on your body including hair pins and              piercings  Do not wear jewelry, make-up, lotions, powders or perfumes, deodorant             Do not wear nail polish.  Do not shave  48 hours prior to surgery.              Men may shave face and neck.   Do not bring valuables to the hospital. [facility] IS NOT             RESPONSIBLE   FOR VALUABLES.  Contacts, dentures or bridgework may not be worn into surgery.  Leave suitcase in the car. After surgery it may be brought to your room.     Patients discharged the day of surgery will not be allowed to drive home.  Name and phone number of your driver:  Special Instructions: N/A              Please read over the following fact sheets you were given: _____________________________________________________________________             [facility] Preparing [facility] Before surgery, you can play an important role.  Because skin is not sterile, your skin needs to be as free of germs as possible.  You can reduce the number of germs on your skin by washing with CHG (chlorahexidine gluconate) soap before surgery.  CHG is an antiseptic cleaner which kills germs and bonds with the skin to continue killing germs even after washing. Please DO NOT use if you have an allergy to CHG or antibacterial soaps.  If your skin becomes reddened/irritated stop using the  CHG and inform your nurse when you arrive at Short Stay. Do not shave (including legs and underarms) for at least 48 hours prior to the first CHG shower.  You may shave your face/neck. Please follow these instructions carefully:  1.  Shower with CHG Soap the night before surgery and the  morning of Surgery.  2.  If you choose to wash your hair, wash your hair first as usual with your  normal  shampoo.  3.  After you shampoo, rinse your hair and body thoroughly to remove the  shampoo.                           4.  Use CHG as you would any other liquid soap.  You can apply chg directly  to the skin and wash  Gently with a scrungie or clean washcloth.  5.  Apply the CHG Soap to your body ONLY FROM THE NECK DOWN.   Do not use on face/ open                           Wound or open sores. Avoid contact with eyes, ears mouth and genitals (private parts).                       Wash face,  Genitals (private parts) with your normal soap.             6.  Wash thoroughly, paying special attention to the area where your surgery  will be performed.  7.  Thoroughly rinse your body with warm water from the neck down.  8.  DO NOT shower/wash with your normal soap after using and rinsing off  the CHG Soap.                9.  Pat yourself dry with a clean towel.            10.  Wear clean pajamas.            11.  Place clean sheets on your bed the night of your first shower and do not  sleep with pets. Day of Surgery : Do not apply any lotions/deodorants the morning of surgery.  Please wear clean clothes to the hospital/surgery center.  FAILURE TO FOLLOW THESE INSTRUCTIONS MAY RESULT IN THE CANCELLATION OF YOUR SURGERY PATIENT SIGNATURE_________________________________  NURSE SIGNATURE__________________________________  ________________________________________________________________________

## 2015-07-18 ENCOUNTER — Encounter (HOSPITAL_COMMUNITY): Payer: Self-pay

## 2015-07-18 ENCOUNTER — Encounter (HOSPITAL_COMMUNITY)
Admission: RE | Admit: 2015-07-18 | Discharge: 2015-07-18 | Disposition: A | Discharge status: Home / Self Care | Payer: BC Managed Care – PPO | Source: Ambulatory Visit | Attending: General Surgery | Admitting: General Surgery

## 2015-07-18 DIAGNOSIS — Z01812 Encounter for preprocedural laboratory examination: Secondary | ICD-10-CM | POA: Diagnosis present

## 2015-07-18 DIAGNOSIS — K8080 Other cholelithiasis without obstruction: Secondary | ICD-10-CM | POA: Diagnosis not present

## 2015-07-18 HISTORY — DX: Allergy status to unspecified drugs, medicaments and biological substances: Z88.9

## 2015-07-18 HISTORY — DX: Unspecified osteoarthritis, unspecified site: M19.90

## 2015-07-18 HISTORY — DX: Headache, unspecified: R51.9

## 2015-07-18 HISTORY — DX: Gastro-esophageal reflux disease without esophagitis: K21.9

## 2015-07-18 HISTORY — DX: Headache: R51

## 2015-07-18 LAB — BASIC METABOLIC PANEL
ANION GAP: 8 (ref 5–15)
BUN: 6 mg/dL (ref 6–20)
CHLORIDE: 100 mmol/L — AB (ref 101–111)
CO2: 29 mmol/L (ref 22–32)
Calcium: 9.5 mg/dL (ref 8.9–10.3)
Creatinine, Ser: 0.57 mg/dL (ref 0.44–1.00)
GFR calc Af Amer: >60 mL/min (ref >60)
GLUCOSE: 97 mg/dL (ref 65–99)
POTASSIUM: 3.8 mmol/L (ref 3.5–5.1)
Sodium: 137 mmol/L (ref 135–145)

## 2015-07-18 LAB — HCG, SERUM, QUALITATIVE: PREG SERUM: NEGATIVE

## 2015-07-19 NOTE — Progress Notes (Signed)
Albertina SenegalMarly Adams, Interpreter for Spanish interpretered for patient for pre-op interview with nurse.

## 2015-07-23 ENCOUNTER — Encounter (HOSPITAL_COMMUNITY)
Admission: RE | Disposition: A | Discharge status: Home / Self Care | Payer: Self-pay | Source: Ambulatory Visit | Attending: General Surgery

## 2015-07-23 ENCOUNTER — Ambulatory Visit (HOSPITAL_COMMUNITY)
Admission: RE | Admit: 2015-07-23 | Discharge: 2015-07-23 | Disposition: A | Discharge status: Home / Self Care | Payer: BC Managed Care – PPO | Source: Ambulatory Visit | Attending: General Surgery | Admitting: General Surgery

## 2015-07-23 ENCOUNTER — Encounter (HOSPITAL_COMMUNITY): Payer: Self-pay | Admitting: Certified Registered"

## 2015-07-23 ENCOUNTER — Ambulatory Visit (HOSPITAL_COMMUNITY): Payer: BC Managed Care – PPO | Admitting: Anesthesiology

## 2015-07-23 DIAGNOSIS — M1990 Unspecified osteoarthritis, unspecified site: Secondary | ICD-10-CM | POA: Diagnosis not present

## 2015-07-23 DIAGNOSIS — K8020 Calculus of gallbladder without cholecystitis without obstruction: Secondary | ICD-10-CM | POA: Diagnosis present

## 2015-07-23 DIAGNOSIS — K219 Gastro-esophageal reflux disease without esophagitis: Secondary | ICD-10-CM | POA: Diagnosis not present

## 2015-07-23 DIAGNOSIS — K8010 Calculus of gallbladder with chronic cholecystitis without obstruction: Secondary | ICD-10-CM | POA: Diagnosis not present

## 2015-07-23 HISTORY — PX: CHOLECYSTECTOMY: SHX55

## 2015-07-23 SURGERY — LAPAROSCOPIC CHOLECYSTECTOMY
Anesthesia: General

## 2015-07-23 MED ORDER — OXYCODONE HCL 5 MG PO TABS
5.0000 mg | ORAL_TABLET | ORAL | Status: DC | PRN
  Administered 2015-07-23 (×2): 5 mg via ORAL
  Filled 2015-07-23 (×2): qty 1

## 2015-07-23 MED ORDER — LACTATED RINGERS IV SOLN
INTRAVENOUS | Status: DC
  Administered 2015-07-23: 14:00:00 via INTRAVENOUS

## 2015-07-23 MED ORDER — MIDAZOLAM HCL 5 MG/5ML IJ SOLN
INTRAMUSCULAR | Status: DC | PRN
  Administered 2015-07-23: 2 mg via INTRAVENOUS

## 2015-07-23 MED ORDER — LIDOCAINE HCL (PF) 2 % IJ SOLN
INTRAMUSCULAR | Status: DC | PRN
  Administered 2015-07-23: 20 mg via INTRADERMAL

## 2015-07-23 MED ORDER — SUGAMMADEX SODIUM 200 MG/2ML IV SOLN
INTRAVENOUS | Status: AC
  Filled 2015-07-23: qty 2

## 2015-07-23 MED ORDER — CHLORHEXIDINE GLUCONATE 4 % EX LIQD: 1.0000 "application " | Freq: Once | CUTANEOUS | Status: DC

## 2015-07-23 MED ORDER — CEFAZOLIN SODIUM-DEXTROSE 2-4 GM/100ML-% IV SOLN
INTRAVENOUS | Status: AC
  Filled 2015-07-23: qty 100

## 2015-07-23 MED ORDER — SODIUM CHLORIDE 0.9 % IV SOLN: 250.0000 mL | INTRAVENOUS | Status: DC | PRN

## 2015-07-23 MED ORDER — PROPOFOL 10 MG/ML IV BOLUS
INTRAVENOUS | Status: DC | PRN
  Administered 2015-07-23: 150 mg via INTRAVENOUS

## 2015-07-23 MED ORDER — ACETAMINOPHEN 650 MG RE SUPP
650.0000 mg | RECTAL | Status: DC | PRN
  Filled 2015-07-23: qty 1

## 2015-07-23 MED ORDER — SUGAMMADEX SODIUM 200 MG/2ML IV SOLN
INTRAVENOUS | Status: DC | PRN
  Administered 2015-07-23: 200 mg via INTRAVENOUS

## 2015-07-23 MED ORDER — ONDANSETRON HCL 4 MG/2ML IJ SOLN
INTRAMUSCULAR | Status: AC
  Filled 2015-07-23: qty 2

## 2015-07-23 MED ORDER — BUPIVACAINE-EPINEPHRINE (PF) 0.25% -1:200000 IJ SOLN
INTRAMUSCULAR | Status: DC | PRN
  Administered 2015-07-23: 10 mL

## 2015-07-23 MED ORDER — BUPIVACAINE-EPINEPHRINE (PF) 0.25% -1:200000 IJ SOLN
INTRAMUSCULAR | Status: AC
  Filled 2015-07-23: qty 30

## 2015-07-23 MED ORDER — HYDROMORPHONE HCL 1 MG/ML IJ SOLN
INTRAMUSCULAR | Status: AC
  Administered 2015-07-23: 0.5 mg via INTRAVENOUS
  Filled 2015-07-23: qty 1

## 2015-07-23 MED ORDER — ONDANSETRON HCL 4 MG/2ML IJ SOLN
INTRAMUSCULAR | Status: DC | PRN
Start: 2015-07-23 — End: 2015-07-23
  Administered 2015-07-23: 4 mg via INTRAVENOUS

## 2015-07-23 MED ORDER — DEXAMETHASONE SODIUM PHOSPHATE 10 MG/ML IJ SOLN
INTRAMUSCULAR | Status: DC | PRN
  Administered 2015-07-23: 10 mg via INTRAVENOUS

## 2015-07-23 MED ORDER — MIDAZOLAM HCL 2 MG/2ML IJ SOLN
INTRAMUSCULAR | Status: AC
  Filled 2015-07-23: qty 2

## 2015-07-23 MED ORDER — ROCURONIUM BROMIDE 100 MG/10ML IV SOLN
INTRAVENOUS | Status: DC | PRN
  Administered 2015-07-23: 45 mg via INTRAVENOUS
  Administered 2015-07-23: 5 mg via INTRAVENOUS

## 2015-07-23 MED ORDER — DEXAMETHASONE SODIUM PHOSPHATE 10 MG/ML IJ SOLN
INTRAMUSCULAR | Status: AC
  Filled 2015-07-23: qty 1

## 2015-07-23 MED ORDER — FENTANYL CITRATE (PF) 100 MCG/2ML IJ SOLN
INTRAMUSCULAR | Status: DC | PRN
  Administered 2015-07-23 (×3): 50 ug via INTRAVENOUS
  Administered 2015-07-23: 100 ug via INTRAVENOUS

## 2015-07-23 MED ORDER — ACETAMINOPHEN 325 MG PO TABS: 650.0000 mg | ORAL_TABLET | ORAL | Status: DC | PRN

## 2015-07-23 MED ORDER — HYDROMORPHONE HCL 1 MG/ML IJ SOLN
0.2500 mg | INTRAMUSCULAR | Status: DC | PRN
  Administered 2015-07-23 (×4): 0.5 mg via INTRAVENOUS

## 2015-07-23 MED ORDER — MORPHINE SULFATE (PF) 10 MG/ML IV SOLN: 2.0000 mg | INTRAVENOUS | Status: DC | PRN

## 2015-07-23 MED ORDER — LACTATED RINGERS IR SOLN
Status: DC | PRN
  Administered 2015-07-23: 1000 mL

## 2015-07-23 MED ORDER — SODIUM CHLORIDE 0.9% FLUSH: 3.0000 mL | INTRAVENOUS | Status: DC | PRN

## 2015-07-23 MED ORDER — OXYCODONE-ACETAMINOPHEN 5-325 MG PO TABS: 1.0000 | ORAL_TABLET | ORAL | Status: DC | PRN

## 2015-07-23 MED ORDER — ROCURONIUM BROMIDE 100 MG/10ML IV SOLN
INTRAVENOUS | Status: AC
  Filled 2015-07-23: qty 1

## 2015-07-23 MED ORDER — FENTANYL CITRATE (PF) 250 MCG/5ML IJ SOLN
INTRAMUSCULAR | Status: AC
  Filled 2015-07-23: qty 5

## 2015-07-23 MED ORDER — SODIUM CHLORIDE 0.9% FLUSH: 3.0000 mL | Freq: Two times a day (BID) | INTRAVENOUS | Status: DC

## 2015-07-23 MED ORDER — CEFAZOLIN SODIUM-DEXTROSE 2-4 GM/100ML-% IV SOLN
2.0000 g | INTRAVENOUS | Status: AC
  Administered 2015-07-23: 2 g via INTRAVENOUS
  Filled 2015-07-23: qty 100

## 2015-07-23 MED ORDER — PROPOFOL 10 MG/ML IV BOLUS
INTRAVENOUS | Status: AC
Start: 2015-07-23 — End: 2015-07-23
  Filled 2015-07-23: qty 20

## 2015-07-23 MED ORDER — ONDANSETRON HCL 4 MG/2ML IJ SOLN
4.0000 mg | Freq: Once | INTRAMUSCULAR | Status: AC | PRN
  Administered 2015-07-23: 4 mg via INTRAVENOUS
  Filled 2015-07-23: qty 2

## 2015-07-23 SURGICAL SUPPLY — 44 items
APL SKNCLS STERI-STRIP NONHPOA (GAUZE/BANDAGES/DRESSINGS) ×1
APPLIER CLIP 5 13 M/L LIGAMAX5 (MISCELLANEOUS)
APR CLP MED LRG 5 ANG JAW (MISCELLANEOUS)
BENZOIN TINCTURE PRP APPL 2/3 (GAUZE/BANDAGES/DRESSINGS) ×2 IMPLANT
CABLE HIGH FREQUENCY MONO STRZ (ELECTRODE) ×3 IMPLANT
CHLORAPREP W/TINT 26ML (MISCELLANEOUS) ×3 IMPLANT
CLIP APPLIE 5 13 M/L LIGAMAX5 (MISCELLANEOUS) IMPLANT
CLIP LIGATING HEMO O LOK GREEN (MISCELLANEOUS) ×3 IMPLANT
CLOSURE WOUND 1/2 X4 (GAUZE/BANDAGES/DRESSINGS) ×1
COVER MAYO STAND STRL (DRAPES) IMPLANT
COVER SURGICAL LIGHT HANDLE (MISCELLANEOUS) ×3 IMPLANT
COVER TRANSDUCER ULTRASND (DRAPES) ×3 IMPLANT
DECANTER SPIKE VIAL GLASS SM (MISCELLANEOUS) ×3 IMPLANT
DEVICE TROCAR PUNCTURE CLOSURE (ENDOMECHANICALS) ×3 IMPLANT
DRAPE C-ARM 42X120 X-RAY (DRAPES) IMPLANT
DRAPE LAPAROSCOPIC ABDOMINAL (DRAPES) ×3 IMPLANT
DRAPE UTILITY XL STRL (DRAPES) ×3 IMPLANT
ELECT REM PT RETURN 9FT ADLT (ELECTROSURGICAL) ×3
ELECTRODE REM PT RTRN 9FT ADLT (ELECTROSURGICAL) ×1 IMPLANT
GAUZE SPONGE 2X2 8PLY STRL LF (GAUZE/BANDAGES/DRESSINGS) ×1 IMPLANT
GAUZE SPONGE 4X4 12PLY STRL (GAUZE/BANDAGES/DRESSINGS) ×3 IMPLANT
GLOVE BIO SURGEON STRL SZ7.5 (GLOVE) ×3 IMPLANT
GOWN STRL REUS W/TWL XL LVL3 (GOWN DISPOSABLE) ×6 IMPLANT
HEMOSTAT SURGICEL 4X8 (HEMOSTASIS) IMPLANT
KIT BASIN OR (CUSTOM PROCEDURE TRAY) ×3 IMPLANT
NDL INSUFFLATION 14GA 120MM (NEEDLE) ×1 IMPLANT
NEEDLE INSUFFLATION 14GA 120MM (NEEDLE) ×3 IMPLANT
PAD POSITIONING PINK XL (MISCELLANEOUS) IMPLANT
POSITIONER SURGICAL ARM (MISCELLANEOUS) IMPLANT
SCISSORS LAP 5X35 DISP (ENDOMECHANICALS) ×3 IMPLANT
SET CHOLANGIOGRAPH MIX (MISCELLANEOUS) IMPLANT
SET IRRIG TUBING LAPAROSCOPIC (IRRIGATION / IRRIGATOR) ×3 IMPLANT
SPONGE GAUZE 2X2 STER 10/PKG (GAUZE/BANDAGES/DRESSINGS) ×2
STRIP CLOSURE SKIN 1/2X4 (GAUZE/BANDAGES/DRESSINGS) ×2 IMPLANT
SUT MNCRL AB 4-0 PS2 18 (SUTURE) ×3 IMPLANT
SUT VICRYL 0 UR6 27IN ABS (SUTURE) ×2 IMPLANT
TAPE CLOTH 4X10 WHT NS (GAUZE/BANDAGES/DRESSINGS) IMPLANT
TAPE CLOTH SURG 4X10 WHT LF (GAUZE/BANDAGES/DRESSINGS) ×2 IMPLANT
TOWEL OR 17X26 10 PK STRL BLUE (TOWEL DISPOSABLE) ×3 IMPLANT
TOWEL OR NON WOVEN STRL DISP B (DISPOSABLE) ×3 IMPLANT
TRAY LAPAROSCOPIC (CUSTOM PROCEDURE TRAY) ×3 IMPLANT
TROCAR BLADELESS OPT 5 75 (ENDOMECHANICALS) ×3 IMPLANT
TROCAR SLEEVE XCEL 5X75 (ENDOMECHANICALS) ×3 IMPLANT
TROCAR XCEL NON-BLD 11X100MML (ENDOMECHANICALS) ×3 IMPLANT

## 2015-07-23 NOTE — Op Note (Signed)
07/23/2015  3:39 PM  PATIENT:  Brittany DonningMaria C Henry  50 y.o. female  PRE-OPERATIVE DIAGNOSIS: symptomatic gallstones  POST-OPERATIVE DIAGNOSIS:  gallstones  PROCEDURE:  Procedure(s): LAPAROSCOPIC CHOLECYSTECTOMY (N/A)  SURGEON:  Surgeon(s) and Role:    * Axel FillerArmando Emelia Sandoval, MD - Primary  PHYSICIAN ASSISTANT:   ASSISTANTS: none   ANESTHESIA:   local and general  EBL:  Total I/O In: -  Out: 5 [Blood:5]  BLOOD ADMINISTERED:none  DRAINS: none   LOCAL MEDICATIONS USED:  BUPIVICAINE   SPECIMEN:  Source of Specimen:  gallbladder  DISPOSITION OF SPECIMEN:  PATHOLOGY  COUNTS:  YES  TOURNIQUET:  * No tourniquets in log *  DICTATION: .Dragon Dictation  EBL: <5cc   Complications: none   Counts: reported as correct x 2   Findings:chronicn inflammation of the gallbladder and stones  Indications for procedure: Pt is a 50 y/o F with RUQ pain and seen to have gallstones.   Details of the procedure: The patient was taken to the operating and placed in the supine position with bilateral SCDs in place. A time out was called and all facts were verified. A pneumoperitoneum was obtained via A Veress needle technique to a pressure of 14mm of mercury. A 5mm trochar was then placed in the right upper quadrant under visualization, and there were no injuries to any abdominal organs. A 11 mm port was then placed in the umbilical region after infiltrating with local anesthesia under direct visualization. A second epigastric port was placed under direct visualization.   The gallbladder was identified and retracted, the peritoneum was then sharply dissected from the gallbladder and this dissection was carried down to Calot's triangle. The cystic duct was identified and stripped away circumferentially and seen going into the gallbladder 360, the critical angle was obtained.  2 clips were placed proximally one distally and the cystic duct transected. The cystic artery was identified and 2 clips placed  proximally and one distally and transected. We then proceeded to remove the gallbladder off the hepatic fossa with Bovie cautery. A retrieval bag was then placed in the abdomen and gallbladder placed in the bag. The hepatic fossa was then reexamined and hemostasis was achieved with Bovie cautery and was excellent at this portion of the case. The subhepatic fossa and perihepatic fossa was then irrigated until the effluent was clear. The specimen bag and specimen were removed from the abdominal cavity.  The 11 mm trocar fascia was reapproximated with the Endo Close #1 Vicryl x3. The pneumoperitoneum was evacuated and all trochars removed under direct visulalization. The skin was then closed with 4-0 Monocryl and the skin dressed with Steri-Strips, gauze, and tape. The patient was awaken from general anesthesia and taken to the recovery room in stable condition.    PLAN OF CARE: DC after PACU  PATIENT DISPOSITION:  PACU - hemodynamically stable.   Delay start of Pharmacological VTE agent (>24hrs) due to surgical blood loss or risk of bleeding: not applicable

## 2015-07-23 NOTE — Transfer of Care (Signed)
Immediate Anesthesia Transfer of Care Note  Patient: Brittany DonningMaria C Henry  Procedure(s) Performed: Procedure(s): LAPAROSCOPIC CHOLECYSTECTOMY (N/A)  Patient Location: PACU  Anesthesia Type:General  Level of Consciousness:  sedated, patient cooperative and responds to stimulation  Airway & Oxygen Therapy:Patient Spontanous Breathing and Patient connected to face mask oxgen  Post-op Assessment:  Report given to PACU RN and Post -op Vital signs reviewed and stable  Post vital signs:  Reviewed and stable  Last Vitals:  Filed Vitals:   07/23/15 1252  BP: 121/75  Pulse: 57  Temp: 36.8 C  Resp: 16    Complications: No apparent anesthesia complications

## 2015-07-23 NOTE — Progress Notes (Signed)
Up to BR with one person assist. Patient voids a large amount of clear yellow urine. Increase in pain with mobility. Medicated with oxycodone. Warm blankets and emotional support given.

## 2015-07-23 NOTE — Discharge Instructions (Signed)
[facility] ______[facility] °LAPAROSCOPIC SURGERY: POST OP INSTRUCTIONS °Always review your discharge instruction sheet given to you by the facility where your surgery was performed. °IF YOU HAVE DISABILITY OR FAMILY LEAVE FORMS, YOU MUST BRING THEM TO THE OFFICE FOR PROCESSING.   °DO NOT GIVE THEM TO YOUR DOCTOR. ° °1. A prescription for pain medication may be given to you upon discharge.  Take your pain medication as prescribed, if needed.  If narcotic pain medicine is not needed, then you may take acetaminophen (Tylenol) or ibuprofen (Advil) as needed. °2. Take your usually prescribed medications unless otherwise directed. °3. If you need a refill on your pain medication, please contact your pharmacy.  They will contact our office to request authorization. Prescriptions will not be filled after 5pm or on week-ends. °4. You should follow a light diet the first few days after arrival home, such as soup and crackers, etc.  Be sure to include lots of fluids daily. °5. Most patients will experience some swelling and bruising in the area of the incisions.  Ice packs will help.  Swelling and bruising can take several days to resolve.  °6. It is common to experience some constipation if taking pain medication after surgery.  Increasing fluid intake and taking a stool softener (such as Colace) will usually help or prevent this problem from occurring.  A mild laxative (Milk of Magnesia or Miralax) should be taken according to package instructions if there are no bowel movements after 48 hours. °7. Unless discharge instructions indicate otherwise, you may remove your bandages 24-48 hours after surgery, and you may shower at that time.  You may have steri-strips (small skin tapes) in place directly over the incision.  These strips should be left on the skin for 7-10 days.  If your surgeon used skin glue on the incision, you may shower in 24 hours.  The glue will flake off over the next 2-3 weeks.  Any sutures or  staples will be removed at the office during your follow-up visit. °8. ACTIVITIES:  You may resume regular (light) daily activities beginning the next day--such as daily self-care, walking, climbing stairs--gradually increasing activities as tolerated.  You may have sexual intercourse when it is comfortable.  Refrain from any heavy lifting or straining until approved by your doctor. °a. You may drive when you are no longer taking prescription pain medication, you can comfortably wear a seatbelt, and you can safely maneuver your car and apply brakes. °b. RETURN TO WORK:  __________________________________________________________ °9. You should see your doctor in the office for a follow-up appointment approximately 2-3 weeks after your surgery.  Make sure that you call for this appointment within a day or two after you arrive home to insure a convenient appointment time. °10. OTHER INSTRUCTIONS: __________________________________________________________________________________________________________________________ __________________________________________________________________________________________________________________________ °WHEN TO CALL YOUR DOCTOR: °1. Fever over 101.0 °2. Inability to urinate °3. Continued bleeding from incision. °4. Increased pain, redness, or drainage from the incision. °5. Increasing abdominal pain ° °The clinic staff is available to answer your questions during regular business hours.  Please don’t hesitate to call and ask to speak to one of the nurses for clinical concerns.  If you have a medical emergency, go to the nearest emergency room or call 911.  A surgeon from [facility] is always on call at the hospital. °[address], [address], [place], [place]  [address] ? [address] [address], [place], [place]   [address] °[phone] ? [phone] ? FAX [phone] °Web site:   [email or web address] ° ° ° ° ° ° ° °General Anesthesia, Adult, Care After °Refer  to this sheet in the next few weeks. These instructions provide you with information on caring for yourself after your procedure. Your health care provider may also give you more specific instructions. Your treatment has been planned according to current medical practices, but problems sometimes occur. Call your health care provider if you have any problems or questions after your procedure. °WHAT TO EXPECT AFTER THE PROCEDURE °After the procedure, it is typical to experience: °· Sleepiness. °· Nausea and vomiting. °HOME CARE INSTRUCTIONS °· For the first 24 hours after general anesthesia: °¨ Have a responsible person with you. °¨ Do not drive a car. If you are alone, do not take public transportation. °¨ Do not drink alcohol. °¨ Do not take medicine that has not been prescribed by your health care provider. °¨ Do not sign important papers or make important decisions. °¨ You may resume a normal diet and activities as directed by your health care provider. °· Change bandages (dressings) as directed. °· If you have questions or problems that seem related to general anesthesia, call the hospital and ask for the anesthetist or anesthesiologist on call. °SEEK MEDICAL CARE IF: °· You have nausea and vomiting that continue the day after anesthesia. °· You develop a rash. °SEEK IMMEDIATE MEDICAL CARE IF:  °· You have difficulty breathing. °· You have chest pain. °· You have any allergic problems. °  °This information is not intended to replace advice given to you by your health care provider. Make sure you discuss any questions you have with your health care provider. °  °Document Released: 05/18/2000 Document Revised: 03/02/2014 Document Reviewed: 06/10/2011 °Elsevier Interactive Patient Education ©2016 [employer] ° °

## 2015-07-23 NOTE — H&P (View-Only) (Signed)
History of Present Illness Brittany Filler MD; 07/15/2015 10:27 AM) The patient is a 50 year old female who presents for evaluation of gall stones. The patient is a 50 year old Spanish-speaking female who is referred by Dr. Richardean Chimera for evaluation of symptomatic stones. Patient had a long history of abdominal pain, generalized, epigastric, right upper quadrant. Patient underwent ultrasound which revealed multiple stones. Patient states that she mainly has pain after eating fatty foods. Patient has been seen in the ER secondary to her right upper quadrant pain.  Patient works as a Arboriculturist at a [employer]   Other Problems Gilmer Mor, CMA; 07/15/2015 9:54 AM) Anxiety Disorder Back Pain Bladder Problems Cholelithiasis Gastroesophageal Reflux Disease Inguinal Hernia  Past Surgical History Gilmer Mor, CMA; 07/15/2015 9:54 AM) No pertinent past surgical history  Diagnostic Studies History Gilmer Mor, CMA; 07/15/2015 9:54 AM) Colonoscopy never Mammogram within last year Pap Smear 1-5 years ago  Allergies Lamar Laundry Bynum, CMA; 07/15/2015 9:55 AM) No Known Drug Allergies05/22/2017  Medication History (Sonya Bynum, CMA; 07/15/2015 9:55 AM) Carafate (1GM Tablet, Oral) Active. Pantoprazole Sodium (  Tablet DR, Oral) Active. Medications Reconciled  Social History Gilmer Mor, CMA; 07/15/2015 9:54 AM) Alcohol use Occasional alcohol use. Caffeine use Carbonated beverages, Coffee, Tea. No drug use Tobacco use Never smoker.  Family History Gilmer Mor, CMA; 07/15/2015 9:54 AM) Alcohol Abuse Brother. Cancer Mother. Depression Brother. Diabetes Mellitus Brother. Heart Disease Brother. Hypertension Mother. Malignant Neoplasm Of Pancreas Father.  Pregnancy / Birth History Gilmer Mor, CMA; 07/15/2015 9:54 AM) Age at menarche 10 years. Age of menopause 44-50 Gravida 53 Maternal age 30-30 Para 3 Regular periods    Review of Systems Brittany Filler MD; 07/15/2015 10:25 AM) General Present- Appetite Loss, Fatigue and Weight Loss. Not Present- Chills, Fever, Night Sweats and Weight Gain. Skin Present- Jaundice. Not Present- Change in Wart/Mole, Dryness, Hives, New Lesions, Non-Healing Wounds, Rash and Ulcer. HEENT Present- Yellow Eyes. Not Present- Earache, Hearing Loss, Hoarseness, Nose Bleed, Oral Ulcers, Ringing in the Ears, Seasonal Allergies, Sinus Pain, Sore Throat, Visual Disturbances and Wears glasses/contact lenses. Respiratory Not Present- Bloody sputum, Chronic Cough, Difficulty Breathing, Snoring and Wheezing. Breast Not Present- Breast Mass, Breast Pain, Nipple Discharge and Skin Changes. Cardiovascular Not Present- Chest Pain, Difficulty Breathing Lying Down, Leg Cramps, Palpitations, Rapid Heart Rate, Shortness of Breath and Swelling of Extremities. Gastrointestinal Present- Abdominal Pain, Constipation, Nausea and Vomiting. Not Present- Bloating, Bloody Stool, Change in Bowel Habits, Chronic diarrhea, Difficulty Swallowing, Excessive gas, Gets full quickly at meals, Hemorrhoids, Indigestion and Rectal Pain. Female Genitourinary Present- Frequency, Nocturia, Pelvic Pain and Urgency. Not Present- Painful Urination. Musculoskeletal Not Present- Myalgia. Neurological Present- Headaches. Not Present- Decreased Memory, Fainting, Numbness, Seizures, Tingling, Tremor, Trouble walking and Weakness. Psychiatric Present- Anxiety, Change in Sleep Pattern, Fearful and Frequent crying. Not Present- Bipolar and Depression. Endocrine Not Present- Cold Intolerance, Excessive Hunger, Hair Changes, Heat Intolerance, Hot flashes and New Diabetes. Hematology Not Present- Easy Bruising, Excessive bleeding, Gland problems, HIV and Persistent Infections.  Vitals (Sonya Bynum CMA; 07/15/2015 9:55 AM) 07/15/2015 9:54 AM Weight: 161 lb Height: 64in Body Surface Area: 1.78 m Body Mass Index: 27.64 kg/m  Temp.: 97.17F(Temporal)  Pulse: 79  (Regular)  BP: 124/80 (Sitting, Left Arm, Standard)       Physical Exam Brittany Filler, MD; 07/15/2015 10:27 AM) General Mental Status-Alert. General Appearance-Consistent with stated age. Hydration-Well hydrated. Voice-Normal.  Head and Neck Head-normocephalic, atraumatic with no lesions or palpable masses.  Eye Eyeball - Bilateral-Extraocular movements intact. Sclera/Conjunctiva - Bilateral-No scleral  icterus.  Chest and Lung Exam Chest and lung exam reveals -quiet, even and easy respiratory effort with no use of accessory muscles. Inspection Chest Wall - Normal. Back - normal.  Cardiovascular Cardiovascular examination reveals -normal heart sounds, regular rate and rhythm with no murmurs.  Abdomen Inspection Normal Exam - No Hernias. Palpation/Percussion Normal exam - Soft, Non Tender, No Rebound tenderness, No Rigidity (guarding) and No hepatosplenomegaly. Auscultation Normal exam - Bowel sounds normal.  Neurologic Neurologic evaluation reveals -alert and oriented x 3 with no impairment of recent or remote memory. Mental Status-Normal.  Musculoskeletal Normal Exam - Left-Upper Extremity Strength Normal and Lower Extremity Strength Normal. Normal Exam - Right-Upper Extremity Strength Normal, Lower Extremity Weakness.    Assessment & Plan Brittany Henry(Jolana Runkles MD; 07/15/2015 10:27 AM) SYMPTOMATIC CHOLELITHIASIS (K80.20) Impression: 50 year old female with symptomatic gallstones.  1. We will proceed to the operating room for a laparoscopic cholecystectomy 2. Risks and benefits were discussed with the patient to generally include, but not limited to: infection, bleeding, possible need for post op ERCP, damage to the bile ducts, bile leak, and possible need for further surgery. Alternatives were offered and described. All questions were answered and the patient voiced understanding of the procedure and wishes to proceed at this point with a  laparoscopic cholecystectomy

## 2015-07-23 NOTE — Anesthesia Preprocedure Evaluation (Signed)
Anesthesia Evaluation  Patient identified by MRN, date of birth, ID band Patient awake    Reviewed: Allergy & Precautions, H&P , NPO status , Patient's Chart, lab work & pertinent test results  History of Anesthesia Complications Negative for: history of anesthetic complications  Airway Mallampati: II  TM Distance: >3 FB Neck ROM: full    Dental no notable dental hx.    Pulmonary neg pulmonary ROS,    Pulmonary exam normal breath sounds clear to auscultation       Cardiovascular negative cardio ROS Normal cardiovascular exam Rhythm:regular Rate:Normal     Neuro/Psych  Headaches,    GI/Hepatic Neg liver ROS, GERD  ,  Endo/Other  negative endocrine ROS  Renal/GU negative Renal ROS     Musculoskeletal  (+) Arthritis ,   Abdominal   Peds  Hematology negative hematology ROS (+)   Anesthesia Other Findings   Reproductive/Obstetrics negative OB ROS                             Anesthesia Physical Anesthesia Plan  ASA: II  Anesthesia Plan: General   Post-op Pain Management:    Induction: Intravenous  Airway Management Planned: Oral ETT  Additional Equipment:   Intra-op Plan:   Post-operative Plan:   Informed Consent: I have reviewed the patients History and Physical, chart, labs and discussed the procedure including the risks, benefits and alternatives for the proposed anesthesia with the patient or authorized representative who has indicated his/her understanding and acceptance.   Dental Advisory Given  Plan Discussed with: Anesthesiologist, CRNA and Surgeon  Anesthesia Plan Comments:         Anesthesia Quick Evaluation

## 2015-07-23 NOTE — Interval H&P Note (Signed)
History and Physical Interval Note:  07/23/2015 2:10 PM  Brittany Henry  has presented today for surgery, with the diagnosis of gallstones  The various methods of treatment have been discussed with the patient and family. After consideration of risks, benefits and other options for treatment, the patient has consented to  Procedure(s): LAPAROSCOPIC CHOLECYSTECTOMY (N/A) as a surgical intervention .  The patient's history has been reviewed, patient examined, no change in status, stable for surgery.  I have reviewed the patient's chart and labs.  Questions were answered to the patient's satisfaction.     Marigene Ehlersamirez Jr., Jed LimerickArmando

## 2015-07-23 NOTE — Anesthesia Procedure Notes (Signed)
Procedure Name: Intubation Date/Time: 07/23/2015 2:44 PM Performed by: Early OsmondEARGLE, Kierstyn Baranowski E Pre-anesthesia Checklist: Patient identified, Emergency Drugs available, Suction available and Patient being monitored Patient Re-evaluated:Patient Re-evaluated prior to inductionOxygen Delivery Method: Circle System Utilized Preoxygenation: Pre-oxygenation with 100% oxygen Intubation Type: IV induction Ventilation: Mask ventilation without difficulty Laryngoscope Size: Miller and 2 Grade View: Grade I Tube type: Oral Tube size: 7.0 mm Number of attempts: 1 Airway Equipment and Method: Stylet and Oral airway Placement Confirmation: ETT inserted through vocal cords under direct vision,  positive ETCO2 and breath sounds checked- equal and bilateral Secured at: 22 cm Tube secured with: Tape Dental Injury: Teeth and Oropharynx as per pre-operative assessment

## 2015-07-23 NOTE — Anesthesia Postprocedure Evaluation (Signed)
Anesthesia Post Note  Patient: Brittany DonningMaria C Henry  Procedure(s) Performed: Procedure(s) (LRB): LAPAROSCOPIC CHOLECYSTECTOMY (N/A)  Patient location during evaluation: PACU Anesthesia Type: General Level of consciousness: awake and alert Pain management: pain level controlled Vital Signs Assessment: post-procedure vital signs reviewed and stable Respiratory status: spontaneous breathing, nonlabored ventilation, respiratory function stable and patient connected to nasal cannula oxygen Cardiovascular status: blood pressure returned to baseline and stable Postop Assessment: no signs of nausea or vomiting Anesthetic complications: no    Last Vitals:  Filed Vitals:   07/23/15 1252 07/23/15 1553  BP: 121/75 135/86  Pulse: 57 72  Temp: 36.8 C 36.7 C  Resp: 16 11    Last Pain:  Filed Vitals:   07/23/15 1558  PainSc: 4                  Reino KentJudd, Anedra Penafiel J

## 2015-07-24 ENCOUNTER — Encounter (HOSPITAL_COMMUNITY): Payer: Self-pay | Admitting: General Surgery

## 2017-06-10 ENCOUNTER — Encounter: Payer: Self-pay | Admitting: Gynecology

## 2017-06-10 ENCOUNTER — Ambulatory Visit: Payer: BC Managed Care – PPO | Admitting: Gynecology

## 2017-06-10 VITALS — BP 118/76 | Ht 64.0 in | Wt 152.0 lb

## 2017-06-10 DIAGNOSIS — R32 Unspecified urinary incontinence: Secondary | ICD-10-CM

## 2017-06-10 DIAGNOSIS — Z124 Encounter for screening for malignant neoplasm of cervix: Secondary | ICD-10-CM

## 2017-06-10 DIAGNOSIS — N950 Postmenopausal bleeding: Secondary | ICD-10-CM

## 2017-06-10 DIAGNOSIS — N8111 Cystocele, midline: Secondary | ICD-10-CM | POA: Diagnosis not present

## 2017-06-10 DIAGNOSIS — N952 Postmenopausal atrophic vaginitis: Secondary | ICD-10-CM

## 2017-06-10 NOTE — Patient Instructions (Addendum)
Follow-up for the ultrasound as scheduled.  Schedule your mammogram 

## 2017-06-10 NOTE — Progress Notes (Signed)
Brittany Henry [birth year] [id number]        52 y.o.  [id number] new patient presents with provided interpreter with 2 complaints:  1. Cystocele with urinary incontinence.  Patient has had bulging from the vagina particularly with straining and coughing and was diagnosed with cystocele.  Also notes loss of urine with coughing laughing sneezing or if she holds it too long.  No urgency symptoms.  No low back pain fever or chills.  No dysuria. 2. Postmenopausal bleeding.  Patient notes it has been over a year since she has had any bleeding and then 2 weeks ago had an episode of bleeding for several days.  She notes it was like a regular menses flow with cramping.  Also notes some premenstrual type breast tenderness and bloating.  No other bleeding at all for over a year.  It is been 2 years since she has had a gynecologic exam.  Reports Pap smears have all been normal.  Past medical history,surgical history, problem list, medications, allergies, family history and social history were all reviewed and documented as reviewed in the EPIC chart.  ROS:  Performed with pertinent positives and negatives included in the history, assessment and plan.   Additional significant findings : None   Exam: Kennon Portela and provided interpreter assistant Vitals:   06/10/17 1010  BP: 118/76  Weight: 152 lb (68.9 kg)  Height: 5\' 4"  (1.626 m)   Body mass index is 26.09 kg/m.  General appearance:  Normal affect, orientation and appearance. Skin: Grossly normal HEENT: Without gross lesions.  No cervical or supraclavicular adenopathy. Thyroid normal.  Lungs:  Clear without wheezing, rales or rhonchi Cardiac: RR, without RMG Abdominal:  Soft, nontender, without masses, guarding, rebound, organomegaly or hernia Breasts:  Examined lying and sitting without masses, retractions, discharge or axillary adenopathy. Pelvic:  Ext, BUS, Vagina: With atrophic changes.  Second-degree cystocele with straining.  Cervix/uterus  supported.  No significant rectocele  Cervix: With atrophic changes.  Pap smear done  Uterus: Anteverted, normal size, shape and contour, midline and mobile nontender   Adnexa: Without masses or tenderness    Anus and perineum: Normal   Rectovaginal: Normal sphincter tone without palpated masses or tenderness.    Assessment/Plan:  52 y.o. [id number] female with:  1. Postmenopausal bleeding.  No other significant hot flushes or night sweats.  Reviewed differential with the patient to include escape ovulation, atrophic changes, structural such as polyps or submucous myomas, hyperplastic conditions as well as uterine cancer.  Recommend patient proceed with sonohysterogram for cavity assessment. 2. Cystocele with urinary incontinence.  Reviewed situation and anatomy with the patient.  Options to include expectant management, trial of pessary and surgery reviewed.  I discussed each option in the pros and cons.  If she is interested in pursuing surgery then I would recommend following up with urology to allow for incontinence evaluation proceeding the repair.  At this point she is not interested in pursuing anything.  Will rediscuss after her postmenopausal bleeding workup. 3. Mammography 2017.  Need to schedule screening mammogram.  Breast exam normal today. 4. Pap smear 2017 reported.  No copies in the chart.  Pap smear done today secondary for completeness as well as due to her bleeding history.  No history of abnormal Pap smears previously. 5. Colonoscopy 2017.  Repeat at their recommended interval. 6. DEXA never.  Recommend further into the menopause. 7. Health maintenance.  No routine lab work as she will have this done through her primary  physician's office.  Follow-up for sonohysterogram as scheduled.  Dara Lordsimothy P Jonie Burdell MD, 10:57 AM 06/10/2017

## 2017-06-10 NOTE — Addendum Note (Signed)
Addended by: Dayna BarkerGARDNER, Kilie Rund K on: 06/10/2017 11:10 AM   Modules accepted: Orders

## 2017-06-11 LAB — URINALYSIS, COMPLETE W/RFL CULTURE
BILIRUBIN URINE: NEGATIVE
Bacteria, UA: NONE SEEN /HPF
GLUCOSE, UA: NEGATIVE
Hgb urine dipstick: NEGATIVE
Hyaline Cast: NONE SEEN /LPF
KETONES UR: NEGATIVE
LEUKOCYTE ESTERASE: NEGATIVE
NITRITES URINE, INITIAL: NEGATIVE
PH: 6 (ref 5.0–8.0)
Protein, ur: NEGATIVE
RBC / HPF: NONE SEEN /HPF (ref 0–2)
SPECIFIC GRAVITY, URINE: 1.024 (ref 1.001–1.03)
Squamous Epithelial / LPF: NONE SEEN /HPF (ref <=5)
WBC, UA: NONE SEEN /HPF (ref 0–5)

## 2017-06-11 LAB — PAP IG W/ RFLX HPV ASCU

## 2017-06-11 LAB — URINE CULTURE
MICRO NUMBER:: 90478368
Result:: NO GROWTH
SPECIMEN QUALITY:: ADEQUATE

## 2017-06-11 LAB — NO CULTURE INDICATED

## 2017-06-15 NOTE — Addendum Note (Signed)
Addended by: Dara LordsFONTAINE, TIMOTHY P on: 06/15/2017 11:19 AM   Modules accepted: Orders

## 2017-06-24 ENCOUNTER — Other Ambulatory Visit: Payer: Self-pay | Admitting: Gynecology

## 2017-06-24 DIAGNOSIS — N950 Postmenopausal bleeding: Secondary | ICD-10-CM

## 2017-07-08 ENCOUNTER — Ambulatory Visit (INDEPENDENT_AMBULATORY_CARE_PROVIDER_SITE_OTHER): Payer: BC Managed Care – PPO

## 2017-07-08 ENCOUNTER — Ambulatory Visit: Payer: BC Managed Care – PPO | Admitting: Gynecology

## 2017-07-08 ENCOUNTER — Encounter: Payer: Self-pay | Admitting: Gynecology

## 2017-07-08 VITALS — BP 116/74

## 2017-07-08 DIAGNOSIS — N8111 Cystocele, midline: Secondary | ICD-10-CM

## 2017-07-08 DIAGNOSIS — N950 Postmenopausal bleeding: Secondary | ICD-10-CM

## 2017-07-08 DIAGNOSIS — N814 Uterovaginal prolapse, unspecified: Secondary | ICD-10-CM

## 2017-07-08 NOTE — Progress Notes (Signed)
    Brittany Henry [birth year] [id number]        52 y.o.  [id number] presents for sonohysterogram with an interpreter due to episode of postmenopausal bleeding.  Is over 1 year without any bleeding and then had an episode of bleeding.    Past medical history,surgical history, problem list, medications, allergies, family history and social history were all reviewed and documented in the EPIC chart.  Directed ROS with pertinent positives and negatives documented in the history of present illness/assessment and plan.  Exam: [place] assistant BP 116/74 General appearance:  Normal Abdomen soft nontender without masses guarding rebound Pelvic external BUS vagina with atrophic changes.  Second-degree cystocele noted.  Approaching second-degree uterine prolapse also noted.  Cervix atrophic.  Uterus normal size midline mobile nontender.  Adnexa without masses or tenderness.  Ultrasound transvaginal shows uterus normal size and echotexture.  Endometrial echo 1.2 mm.  Right and left ovaries normal.  Cul-de-sac negative.  Sonohysterogram performed, sterile technique, easy catheter introduction, good distention with no abnormalities.  Endometrial sample taken.  Patient tolerated well.  Assessment/Plan:  52 y.o. [id number] with:  1. Episode of postmenopausal bleeding.  Ultrasound is negative with thin endometrial echo.  Will follow-up for biopsy results.  Assuming negative then plan expectant management worth reporting any further bleeding.  Patient otherwise doing well without significant hot flushes, sweats or vaginal dryness. 2. Cystocele/uterine prolapse.  Did not appreciate the degree of uterine prolapse with her initial exam previously.  Discussed situation with the patient and options.  Offered referral to urology for evaluation and consideration of surgery which I think would include a cystocele repair and hysterectomy.  At this point the patient is not interested in pursuing urologic evaluation.  She  wants to just monitor for now.  She will call if she changes her mind and would like a urology referral.    Dara Lords MD, 9:14 AM 07/08/2017

## 2017-07-08 NOTE — Patient Instructions (Signed)
All if you would like to be referred to urology for evaluation of the bladder and urinary incontinence.

## 2018-08-29 ENCOUNTER — Other Ambulatory Visit: Payer: Self-pay

## 2018-08-29 ENCOUNTER — Other Ambulatory Visit: Payer: Self-pay | Admitting: Occupational Medicine

## 2018-08-29 ENCOUNTER — Ambulatory Visit: Payer: Self-pay

## 2018-08-29 DIAGNOSIS — M79672 Pain in left foot: Secondary | ICD-10-CM

## 2018-09-09 ENCOUNTER — Ambulatory Visit: Payer: Self-pay

## 2018-09-09 ENCOUNTER — Other Ambulatory Visit: Payer: Self-pay | Admitting: Family Medicine

## 2018-09-09 ENCOUNTER — Other Ambulatory Visit: Payer: Self-pay

## 2018-09-09 DIAGNOSIS — M79672 Pain in left foot: Secondary | ICD-10-CM

## 2018-11-16 ENCOUNTER — Encounter: Payer: Self-pay | Admitting: Gynecology

## 2019-12-11 IMAGING — DX LEFT FOOT - COMPLETE 3+ VIEW
3 series · 3 of 3 positions shown · non-contrast
Comparison: Left foot x-rays dated August 29, 2018.

CLINICAL DATA: Continued left foot pain.

EXAM:
LEFT FOOT - COMPLETE 3+ VIEW

[foot ap]
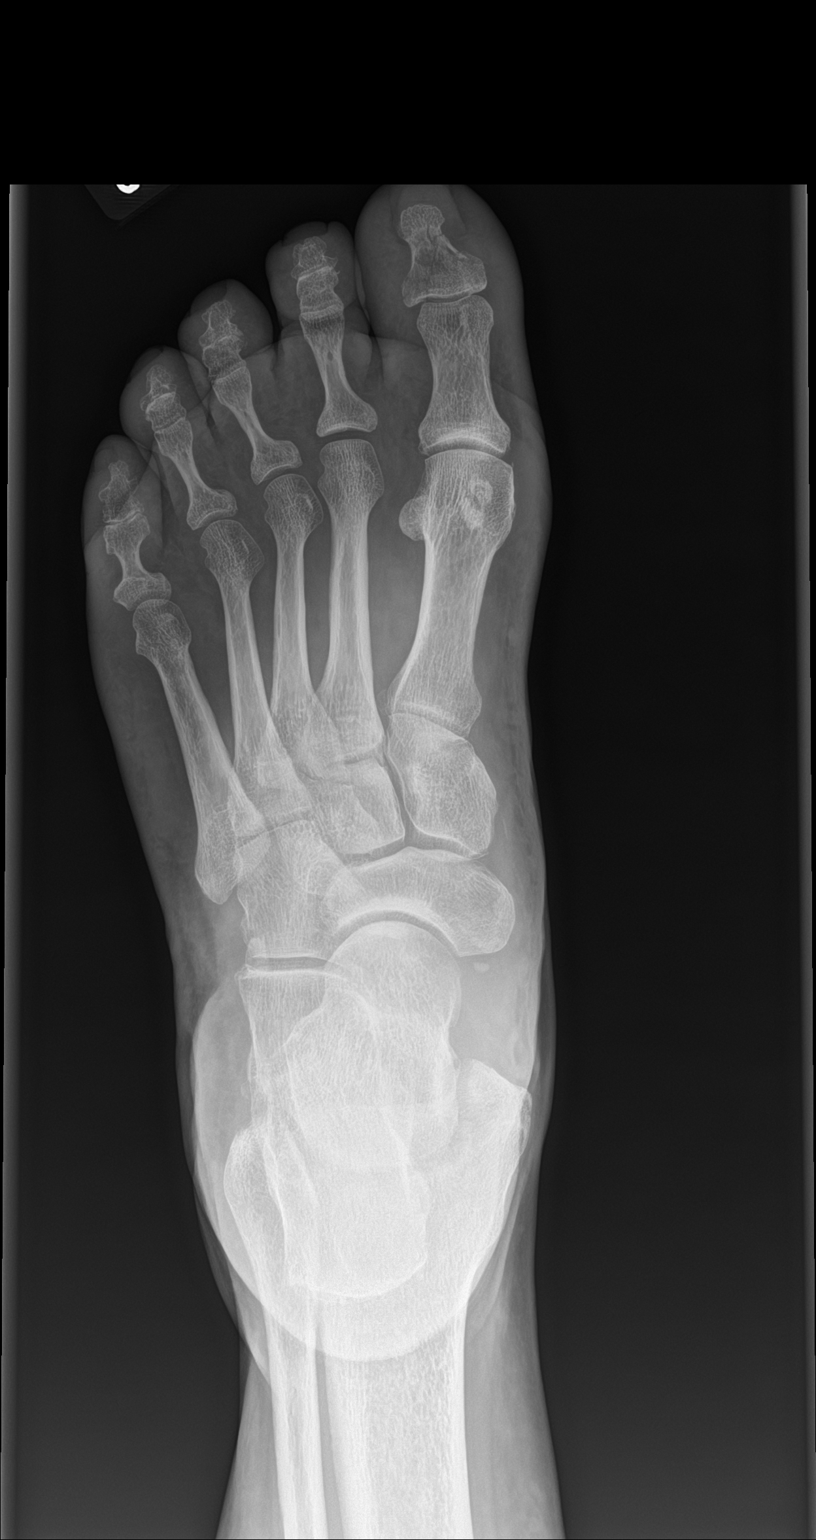

[foot obl]
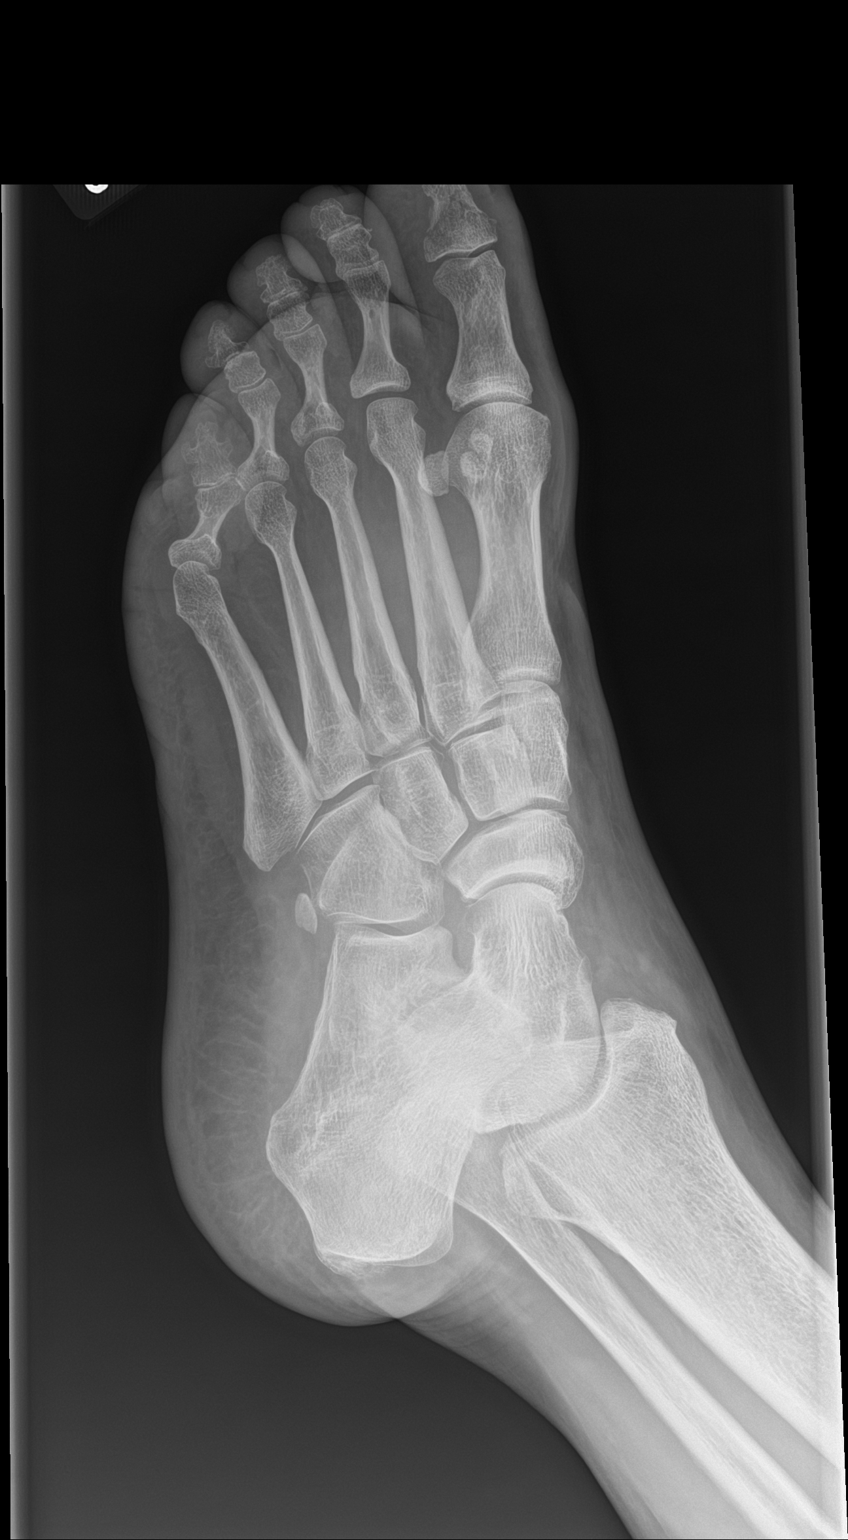

[foot lat]
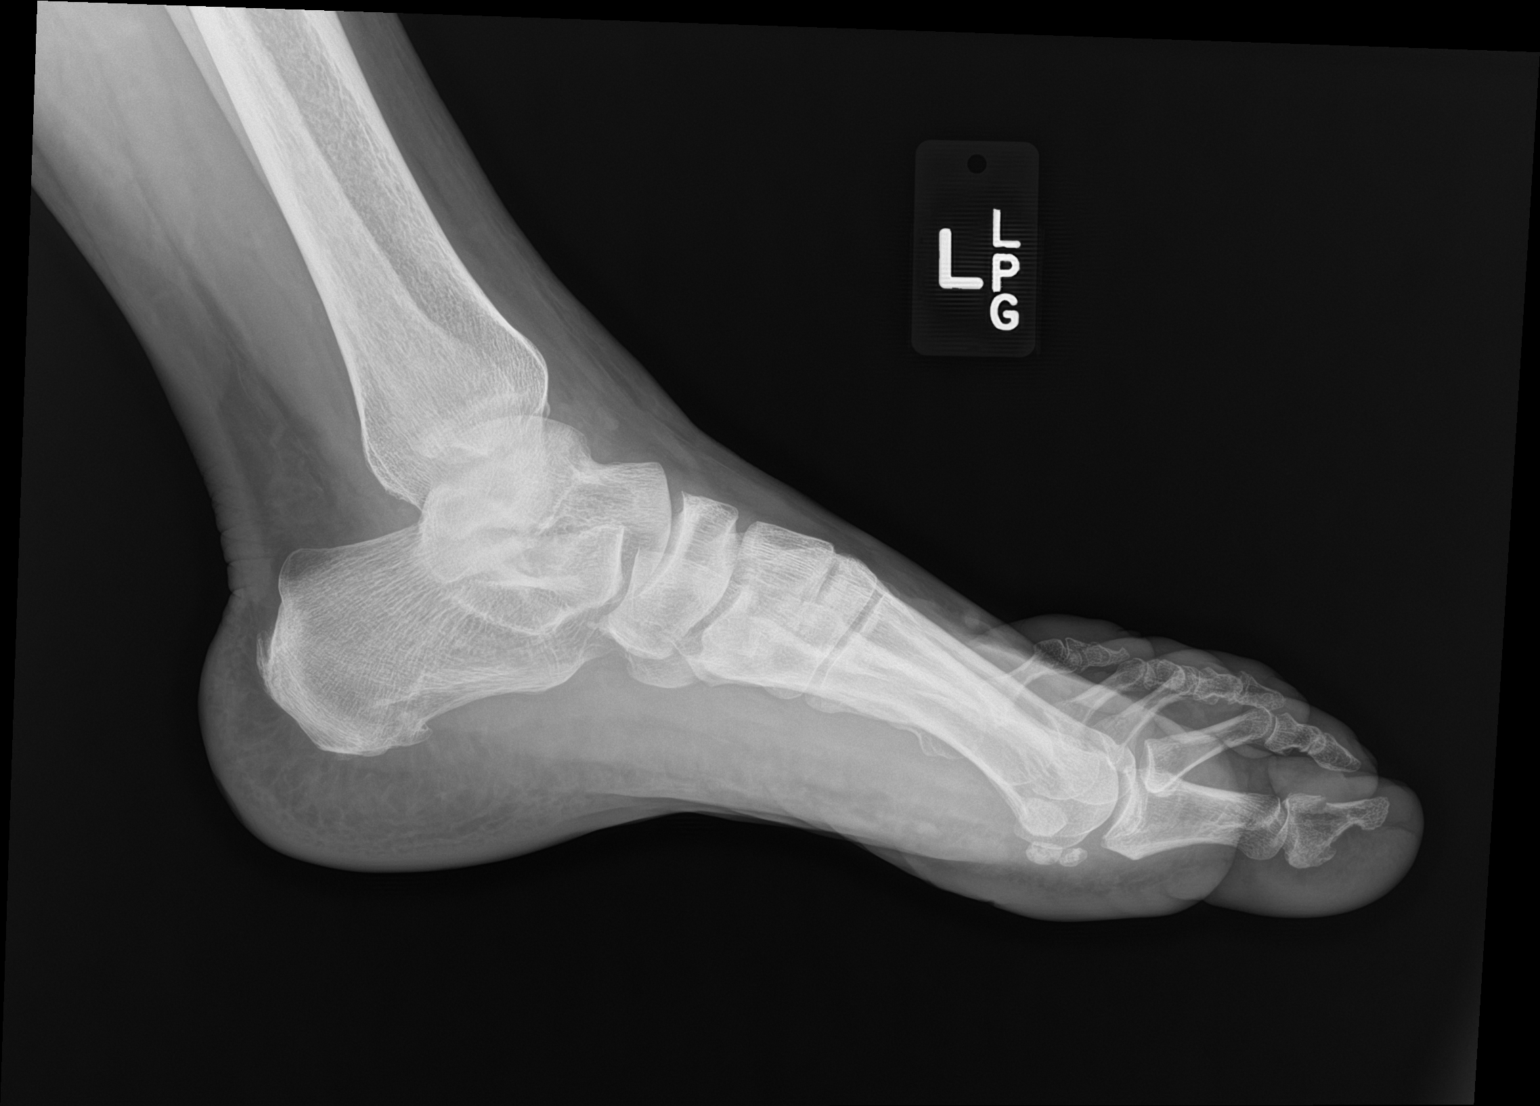

[3 of 3 positions shown; findings below may reference images not displayed]

FINDINGS: Similar appearing acute comminuted nondisplaced intra-articular
fracture of the first distal phalanx. No new fracture. Joint spaces
are preserved. Tiny marginal osteophytes at the base of the first
proximal phalanx. Bone mineralization is normal. Small plantar and
Achilles enthesophytes again noted. Soft tissues are unremarkable.
IMPRESSION: 1. Unchanged acute comminuted nondisplaced fracture of the first
distal phalanx.

## 2021-03-12 ENCOUNTER — Other Ambulatory Visit: Payer: Self-pay

## 2021-03-12 ENCOUNTER — Ambulatory Visit (INDEPENDENT_AMBULATORY_CARE_PROVIDER_SITE_OTHER): Payer: BC Managed Care – PPO | Admitting: Podiatry

## 2021-03-12 DIAGNOSIS — L600 Ingrowing nail: Secondary | ICD-10-CM | POA: Diagnosis not present

## 2021-03-12 NOTE — Progress Notes (Signed)
°  Subjective:  Patient ID: Brittany Henry, female    DOB: [birth year],   MRN: [id number]  Chief Complaint  Patient presents with   debride    Pt is here for [facility].     56 y.o. female presents for concern of ingrown toenails. Relates her right is worse than her left. Relates they have been ongoing for years and is hoping to have something done. She relates she is diabetic although not seen in chart and relates her last A1c was below 7 although not able to confirm in chart. . Denies any other pedal complaints. Denies n/v/f/c.   Past Medical History:  Diagnosis Date   Arthritis    Dysphagia    GERD (gastroesophageal reflux disease)    H/O seasonal allergies    Headache     Objective:  Physical Exam: Vascular: DP/PT pulses 2/4 bilateral. CFT <3 seconds. Normal hair growth on digits. No edema.  Skin. No lacerations or abrasions bilateral feet. Incurvation bilateral borders of right hallux. No erythema edema or purulence noted. Left hallux incurvation but minimally tender.  Musculoskeletal: MMT 5/5 bilateral lower extremities in DF, PF, Inversion and Eversion. Deceased ROM in DF of ankle joint.  Neurological: Sensation intact to light touch.   Assessment:   1. Ingrown nail of great toe of right foot      Plan:  Patient was evaluated and treated and all questions answered. Patient requesting removal of ingrown nail today. Procedure below.  Discussed procedure and post procedure care and patient expressed understanding.  Will follow-up in 2 weeks for nail check or sooner if any problems arise.    Procedure:  Procedure: partial Nail Avulsion of right hallux bilateral nail border.  Surgeon: Lorenda Peck, DPM  Pre-op Dx: Ingrown toenail without infection Post-op: Same  Place of Surgery: Office exam room.  Indications for surgery: Painful and ingrown toenail.    The patient is requesting removal of nail with chemical matrixectomy. Risks and complications were discussed with the  patient for which they understand and written consent was obtained. Under sterile conditions a total of 3 mL of  1% lidocaine plain was infiltrated in a hallux block fashion. Once anesthetized, the skin was prepped in sterile fashion. A tourniquet was then applied. Next the bilateral aspect of hallux nail border was then sharply excised making sure to remove the entire offending nail border.  Next phenol was then applied under standard conditions and copiously irrigated. Silvadene was applied. A dry sterile dressing was applied. After application of the dressing the tourniquet was removed and there is found to be an immediate capillary refill time to the digit. The patient tolerated the procedure well without any complications. Post procedure instructions were discussed the patient for which he verbally understood. Follow-up in two weeks for nail check or sooner if any problems are to arise. Discussed signs/symptoms of infection and directed to call the office immediately should any occur or go directly to the emergency room. In the meantime, encouraged to call the office with any questions, concerns, changes symptoms.   Lorenda Peck, DPM

## 2021-03-12 NOTE — Patient Instructions (Addendum)
Instrucciones de remojo    EL DA DESPUS DEL PROCEDIMIENTO  Coloque 1/4 de taza de sales de epsom (o betadine o vinagre blanco) en un cuarto de galn de agua tibia del grifo. Sumerja su pie o pies con el vendaje exterior intacto para el remojo inicial; esto permitir que el vendaje se humedezca y se moje para levantarlo fcilmente. Una vez que se quite el vendaje, contine sumergido en la solucin durante 20 minutos. Este remojo debe [employer] veces al da. A continuacin, retire [employer] de la solucin, seque el rea afectada y Primary school teacher. Puede usar una curita lo suficientemente grande para cubrir el rea o usar gaInstrucciones de remojo    EL DA DESPUS DEL PROCEDIMIENTO  Coloque 1/4 de taza de Press photographer de epsom (o betadine o vinagre blanco) en un cuarto de galn de agua tibia del grifo. Sumerja su pie o pies con el vendaje exterior intacto para el remojo inicial; esto permitir que el vendaje se humedezca y se moje para levantarlo fcilmente. Una vez que se quite el vendaje, contine sumergido en la solucin durante 20 minutos. Este remojo debe [employer] veces al da. A continuacin, retire [employer] de la solucin, seque el rea afectada y Primary school teacher. Puede usar una curita lo suficientemente grande para cubrir el rea o usar gasa y [place]. Aplique otros medicamentos en el rea segn las indicaciones del mdico, como polisporina neosporina.  SI SU PIEL SE IRRITA MIENTRAS UTILIZA ESTAS INSTRUCCIONES, [place]. O puede usar agua y jabn antibacteriano para Theatre manager limpio el dedo del pie.  Controle cualquier signo/sntoma de infeccin. Llame a la oficina inmediatamente si ocurre algo o vaya directamente a la sala de emergencias. Llame con cualquier pregunta/inquietud.    Instrucciones de cuidado a largo plazo: despus de la ciruga de uas  Le han tratado la ua encarnada y la raz con un qumico. [place] qumico causa una quemadura que drena y  supura como una ampolla. Esto puede drenar durante 6-8 semanas o ms. Es Insurance underwriter esta rea limpia, [place] y seguir las instrucciones de remojo proporcionadas en el momento de la [place]. Esta rea eventualmente se secar y formar Serita Grammes. Una vez que se forma la costra, ya no es necesario remojar ni aplicar un vendaje. Si en algn momento experimenta un aumento del dolor, enrojecimiento, hinchazn o drenaje, debe comunicarse con la oficina lo antes posible.sa y [place]. Aplique otros medicamentos en el rea segn las indicaciones del mdico, como polisporina neosporina.  SI SU PIEL SE IRRITA MIENTRAS UTILIZA ESTAS INSTRUCCIONES, [place]. O puede usar agua y jabn antibacteriano para Theatre manager limpio el dedo del pie.  Controle cualquier signo/sntoma de infeccin. Llame a la oficina inmediatamente si ocurre algo o vaya directamente a la sala de emergencias. Llame con cualquier pregunta/inquietud.    Instrucciones de cuidado a largo plazo: despus de la ciruga de uas  Le han tratado la ua encarnada y la raz con un qumico. [place] qumico causa una quemadura que drena y supura como una ampolla. Esto puede drenar durante 6-8 semanas o ms. Es Insurance underwriter esta rea limpia, [place] y seguir las instrucciones de remojo proporcionadas en el momento de la [place]. Esta rea eventualmente se secar y formar Serita Grammes. Una vez que se forma la costra, ya no es necesario remojar ni aplicar un vendaje. Si en algn momento experimenta un aumento del dolor, enrojecimiento, hinchazn o drenaje,  debe comunicarse con la oficina lo antes posible.

## 2021-03-14 ENCOUNTER — Telehealth: Payer: Self-pay | Admitting: *Deleted

## 2021-03-14 ENCOUNTER — Encounter: Payer: Self-pay | Admitting: Podiatry

## 2021-03-14 NOTE — Telephone Encounter (Signed)
Please schedule for a sooner appointment w/ Dr Yehuda Savannah note.

## 2021-03-14 NOTE — Telephone Encounter (Signed)
Patient is calling because she is in a lot of pain since nail procedure, unable to go to work today(does a lot of walking), requesting a out of work note and something stronger for the pain.She is taking tylenol -500 mg, not helping. Please advise.

## 2021-03-26 ENCOUNTER — Encounter: Payer: Self-pay | Admitting: Podiatry

## 2021-03-26 ENCOUNTER — Other Ambulatory Visit: Payer: Self-pay

## 2021-03-26 ENCOUNTER — Ambulatory Visit (INDEPENDENT_AMBULATORY_CARE_PROVIDER_SITE_OTHER): Payer: BC Managed Care – PPO | Admitting: Podiatry

## 2021-03-26 DIAGNOSIS — M79674 Pain in right toe(s): Secondary | ICD-10-CM | POA: Diagnosis not present

## 2021-03-26 DIAGNOSIS — E1142 Type 2 diabetes mellitus with diabetic polyneuropathy: Secondary | ICD-10-CM

## 2021-03-26 DIAGNOSIS — M79675 Pain in left toe(s): Secondary | ICD-10-CM | POA: Diagnosis not present

## 2021-03-26 DIAGNOSIS — B351 Tinea unguium: Secondary | ICD-10-CM | POA: Diagnosis not present

## 2021-03-26 DIAGNOSIS — L600 Ingrowing nail: Secondary | ICD-10-CM

## 2021-03-26 NOTE — Progress Notes (Signed)
°  Subjective:  Patient ID: Brittany Henry, female    DOB: 04-28-1965,   MRN: [id number]  Chief Complaint  Patient presents with   Ingrown Toenail    Follow up nail procedure hallux right   "Its still tender on the sides." Patient still soaking and covering   Debridement    Requesting nail trim    56 y.o. female presents for follow-up of ingrown nail procedure as well as concern for painful elongated toenails requesting to have them trimmed. .She relates burning and tingling occasionally in her feet. She relates she is diabetic although not seen in chart and relates her last A1c was below 7 although not able to confirm in chart. . Denies any other pedal complaints. Denies n/v/f/c.   Past Medical History:  Diagnosis Date   Arthritis    Dysphagia    GERD (gastroesophageal reflux disease)    H/O seasonal allergies    Headache     Objective:  Physical Exam: Vascular: DP/PT pulses 2/4 bilateral. CFT <3 seconds. Absent hair growth with mild edema noted to bilateral extremities.   Skin. No lacerations or abrasions bilateral feet. Right hallux appears well healed. Nails 1-5 are thickened discolored and elongated with subungual debris.  Musculoskeletal: MMT 5/5 bilateral lower extremities in DF, PF, Inversion and Eversion. Deceased ROM in DF of ankle joint.  Neurological: Sensation intact to light touch.   Assessment:   1. Ingrown nail of great toe of right foot   2. Pain due to onychomycosis of toenails of both feet   3. Type 2 diabetes mellitus with diabetic polyneuropathy, unspecified whether long term insulin use (HCC)       Plan:  Patient was evaluated and treated and all questions answered. Toe was evaluated and appears to be healing well.  May discontinue soaks and neosporin.  -Discussed and educated patient on diabetic foot care, especially with  regards to the vascular, neurological and musculoskeletal systems.  -Stressed the importance of good glycemic control and the  detriment of not  controlling glucose levels in relation to the foot. -Discussed supportive shoes at all times and checking feet regularly.  -Mechanically debrided all nails 1-5 bilateral using sterile nail nipper and filed with dremel without incident  -Answered all patient questions -Patient to return  in 3 months for at risk foot care -Patient advised to call the office if any problems or questions arise in the meantime.    Louann Sjogren, DPM

## 2021-06-04 ENCOUNTER — Encounter: Payer: Self-pay | Admitting: Podiatry

## 2021-06-23 ENCOUNTER — Ambulatory Visit: Payer: BC Managed Care – PPO | Admitting: Podiatry
